# Patient Record
Sex: Female | Born: 1950 | Race: White | Hispanic: No | Marital: Married | Smoking: Former smoker
Health system: Southern US, Community
[De-identification: ages and names within clinical notes are randomized; demographics above are authoritative.]

## PROBLEM LIST (undated history)

## (undated) DIAGNOSIS — M1990 Unspecified osteoarthritis, unspecified site: Secondary | ICD-10-CM

## (undated) DIAGNOSIS — G709 Myoneural disorder, unspecified: Secondary | ICD-10-CM

## (undated) DIAGNOSIS — J449 Chronic obstructive pulmonary disease, unspecified: Secondary | ICD-10-CM

## (undated) DIAGNOSIS — E785 Hyperlipidemia, unspecified: Secondary | ICD-10-CM

## (undated) DIAGNOSIS — I10 Essential (primary) hypertension: Secondary | ICD-10-CM

## (undated) HISTORY — PX: TUBAL LIGATION: SHX77

## (undated) HISTORY — PX: JOINT REPLACEMENT: SHX530

---

## 1998-04-15 ENCOUNTER — Other Ambulatory Visit: Admission: RE | Admit: 1998-04-15 | Discharge: 1998-04-15 | Payer: Self-pay | Admitting: Obstetrics & Gynecology

## 1998-07-08 ENCOUNTER — Other Ambulatory Visit: Admission: RE | Admit: 1998-07-08 | Discharge: 1998-07-08 | Payer: Self-pay | Admitting: Obstetrics & Gynecology

## 1999-02-13 ENCOUNTER — Other Ambulatory Visit: Admission: RE | Admit: 1999-02-13 | Discharge: 1999-02-13 | Payer: Self-pay | Admitting: Obstetrics and Gynecology

## 2000-02-02 ENCOUNTER — Other Ambulatory Visit: Admission: RE | Admit: 2000-02-02 | Discharge: 2000-02-02 | Payer: Self-pay | Admitting: Obstetrics and Gynecology

## 2000-06-09 ENCOUNTER — Other Ambulatory Visit: Admission: RE | Admit: 2000-06-09 | Discharge: 2000-06-09 | Payer: Self-pay | Admitting: Otolaryngology

## 2000-10-09 ENCOUNTER — Encounter: Admission: RE | Admit: 2000-10-09 | Discharge: 2000-10-09 | Payer: Self-pay | Admitting: Orthopedic Surgery

## 2000-10-09 ENCOUNTER — Encounter: Payer: Self-pay | Admitting: Orthopedic Surgery

## 2001-02-04 ENCOUNTER — Other Ambulatory Visit: Admission: RE | Admit: 2001-02-04 | Discharge: 2001-02-04 | Payer: Self-pay | Admitting: *Deleted

## 2001-02-04 ENCOUNTER — Other Ambulatory Visit: Admission: RE | Admit: 2001-02-04 | Discharge: 2001-02-04 | Payer: Self-pay | Admitting: Obstetrics and Gynecology

## 2002-02-08 ENCOUNTER — Other Ambulatory Visit: Admission: RE | Admit: 2002-02-08 | Discharge: 2002-02-08 | Payer: Self-pay | Admitting: Obstetrics and Gynecology

## 2002-04-03 ENCOUNTER — Encounter: Admission: RE | Admit: 2002-04-03 | Discharge: 2002-04-03 | Payer: Self-pay | Admitting: Orthopedic Surgery

## 2002-04-03 ENCOUNTER — Encounter: Payer: Self-pay | Admitting: Orthopedic Surgery

## 2003-03-09 ENCOUNTER — Other Ambulatory Visit: Admission: RE | Admit: 2003-03-09 | Discharge: 2003-03-09 | Payer: Self-pay | Admitting: Obstetrics and Gynecology

## 2004-03-19 ENCOUNTER — Other Ambulatory Visit: Admission: RE | Admit: 2004-03-19 | Discharge: 2004-03-19 | Payer: Self-pay | Admitting: Obstetrics and Gynecology

## 2004-08-28 ENCOUNTER — Ambulatory Visit: Payer: Self-pay | Admitting: Internal Medicine

## 2004-09-09 ENCOUNTER — Ambulatory Visit: Payer: Self-pay | Admitting: Internal Medicine

## 2005-03-24 ENCOUNTER — Other Ambulatory Visit: Admission: RE | Admit: 2005-03-24 | Discharge: 2005-03-24 | Payer: Self-pay | Admitting: Obstetrics and Gynecology

## 2007-02-15 ENCOUNTER — Inpatient Hospital Stay (HOSPITAL_COMMUNITY): Admission: RE | Admit: 2007-02-15 | Discharge: 2007-02-20 | Payer: Self-pay | Admitting: Orthopedic Surgery

## 2007-06-19 ENCOUNTER — Encounter: Admission: RE | Admit: 2007-06-19 | Discharge: 2007-06-19 | Payer: Self-pay | Admitting: Orthopedic Surgery

## 2009-06-05 ENCOUNTER — Encounter (INDEPENDENT_AMBULATORY_CARE_PROVIDER_SITE_OTHER): Payer: Self-pay | Admitting: *Deleted

## 2009-06-06 ENCOUNTER — Ambulatory Visit: Payer: Self-pay | Admitting: Internal Medicine

## 2009-06-18 ENCOUNTER — Ambulatory Visit: Payer: Self-pay | Admitting: Internal Medicine

## 2009-06-20 ENCOUNTER — Encounter: Payer: Self-pay | Admitting: Internal Medicine

## 2009-09-19 ENCOUNTER — Encounter (INDEPENDENT_AMBULATORY_CARE_PROVIDER_SITE_OTHER): Payer: Self-pay | Admitting: *Deleted

## 2010-06-03 NOTE — Letter (Signed)
Summary: [facility] Instructions  [facility]  [address] [place], [place] [address]   Phone: [phone]  Fax: [phone]       Kelsey Sheppard    May 29, 1950    MRN: [id number]        Procedure Day Dorna Bloom:  Kelsey Sheppard  06/18/09     Arrival Time:  9:30AM     Procedure Time:  10:30AM     Location of Procedure:                    [name] _  [facility] (4th Floor)                        PREPARATION FOR COLONOSCOPY WITH MOVIPREP   Starting 5 days prior to your procedure 06/13/09 do not eat nuts, seeds, popcorn, corn, beans, peas,  salads, or any raw vegetables.  Do not take any fiber supplements (e.g. Metamucil, Citrucel, and Benefiber).  THE DAY BEFORE YOUR PROCEDURE         DATE: 06/17/09  DAY: MONDAY  1.  Drink clear liquids the entire day-NO SOLID FOOD  2.  Do not drink anything colored red or purple.  Avoid juices with pulp.  No orange juice.  3.  Drink at least 64 oz. (8 glasses) of fluid/clear liquids during the day to prevent dehydration and help the prep work efficiently.  CLEAR LIQUIDS INCLUDE: Water Jello Ice Popsicles Tea (sugar ok, no milk/cream) Powdered fruit flavored drinks Coffee (sugar ok, no milk/cream) Gatorade Juice: apple, white grape, white cranberry  Lemonade Clear bullion, consomm, broth Carbonated beverages (any kind) Strained chicken noodle soup Hard Candy                             4.  In the morning, mix first dose of MoviPrep solution:    Empty 1 Pouch A and 1 Pouch B into the disposable container    Add lukewarm drinking water to the top line of the container. Mix to dissolve    Refrigerate (mixed solution should be used within 24 hrs)  5.  Begin drinking the prep at 5:00 p.m. The MoviPrep container is divided by 4 marks.   Every 15 minutes drink the solution down to the next mark (approximately 8 oz) until the full liter is complete.   6.  Follow completed prep with 16 oz of clear liquid of your choice  (Nothing red or purple).  Continue to drink clear liquids until bedtime.  7.  Before going to bed, mix second dose of MoviPrep solution:    Empty 1 Pouch A and 1 Pouch B into the disposable container    Add lukewarm drinking water to the top line of the container. Mix to dissolve    Refrigerate  THE DAY OF YOUR PROCEDURE      DATE: 06/18/09  DAY: TUESDAY Beginning at 5:30AM (5 hours before procedure):         1. Every 15 minutes, drink the solution down to the next mark (approx 8 oz) until the full liter is complete.  2. Follow completed prep with 16 oz. of clear liquid of your choice.    3. You may drink clear liquids until 8:30AM (2 HOURS BEFORE PROCEDURE).   MEDICATION INSTRUCTIONS  Unless otherwise instructed, you should take regular prescription medications with a small sip of water   as early as possible the morning of  your procedure.           OTHER INSTRUCTIONS  You will need a responsible adult at least 60 years of age to accompany you and drive you home.   This person must remain in the waiting room during your procedure.  Wear loose fitting clothing that is easily removed.  Leave jewelry and other valuables at home.  However, you may wish to bring a book to read or  an iPod/MP3 player to listen to music as you wait for your procedure to start.  Remove all body piercing jewelry and leave at home.  Total time from sign-in until discharge is approximately 2-3 hours.  You should go home directly after your procedure and rest.  You can resume normal activities the  day after your procedure.  The day of your procedure you should not:   Drive   Make legal decisions   Operate machinery   Drink alcohol   Return to work  You will receive specific instructions about eating, activities and medications before you leave.    The above instructions have been reviewed and explained to me by   [name] RN  June 06, 2009 10:17 AM     I fully  understand and can verbalize these instructions _____________________________ Date _________

## 2010-06-03 NOTE — Procedures (Signed)
Summary: Colonoscopy  Patient: Kelsey Sheppard Note: All result statuses are Final unless otherwise noted.  Tests: (1) Colonoscopy (COL)   COL Colonoscopy           DONE     [facility]     [address]     [place], [place]  [address]           COLONOSCOPY PROCEDURE REPORT           PATIENT:  [name], [name]  MR#:  [id number]     BIRTHDATE:  Nov 19, 1950, 58 yrs. old  GENDER:  female           ENDOSCOPIST:  Wilhemina Bonito. Eda Keys, MD     Referred by:  Surveillance Program Recall           PROCEDURE DATE:  06/18/2009     PROCEDURE:  Colonoscopy with snare polypectomy x 4     ASA CLASS:  Class II     INDICATIONS:  history of pre-cancerous (adenomatous) colon polyps     (index exam 09-2004 w/ small adenomas)           MEDICATIONS:   Fentanyl 100 mcg IV, Versed 8 mg IV, Benadryl 25 mg     IV           DESCRIPTION OF PROCEDURE:   After the risks benefits and     alternatives of the procedure were thoroughly explained, informed     consent was obtained.  Digital rectal exam was performed and     revealed no abnormalities.   The LB CF-[id number] endoscope     was introduced through the anus and advanced to the cecum, which     was identified by both the appendix and ileocecal valve, without     limitations.Time to cecum = 6:03 min.  The quality of the prep was     excellent, using MoviPrep.  The instrument was then slowly     withdrawn (time = 14:12 min) as the colon was fully examined.     <<PROCEDUREIMAGES>>           FINDINGS:  Four polyps were found: 5mm in the ascending colon, 6mm     in the transveres and 6mm,3mm in the sigmoid colon. Polyps were     snared without cautery. Retrieval was successful.   Mild     diverticulosis was found in the sigmoid colon.   Retroflexed views     in the rectum revealed no abnormalities.    The scope was then     withdrawn from the patient and the procedure completed.           COMPLICATIONS:  None           ENDOSCOPIC IMPRESSION:  1) Four polyps - Removed     2) Mild diverticulosis in the sigmoid colon           RECOMMENDATIONS:     1) Follow up colonoscopy in in 3 years if 3 or more adenomas;     otherwise 5 years           ______________________________     Wilhemina Bonito. Eda Keys, MD           CC:  Laurann Montana, MD; Huel Cote, MD; The Patient           n.     eSIGNED:   Wilhemina Bonito. Eda Keys at 06/18/2009 12:23 PM  [name], [name], [phone]  Note: An exclamation mark (!) indicates a result that was not dispersed into the flowsheet. Document Creation Date: 06/18/2009 12:24 PM _______________________________________________________________________  (1) Order result status: Final Collection or observation date-time: 06/18/2009 12:16 Requested date-time:  Receipt date-time:  Reported date-time:  Referring Physician:   Ordering Physician: Fransico Setters [phone]) Specimen Source:  Source: Launa Grill Order Number: [id number] Lab site:   Appended Document: Colonoscopy     Procedures Next Due Date:    Colonoscopy: 06/2012

## 2010-06-03 NOTE — Miscellaneous (Signed)
Summary: Moviprep rx  Clinical Lists Changes  Medications: Added new medication of MOVIPREP 100 GM  SOLR (PEG-KCL-NACL-NASULF-NA ASC-C) As per prep instructions. - Signed Rx of MOVIPREP 100 GM  SOLR (PEG-KCL-NACL-NASULF-NA ASC-C) As per prep instructions.;  #1 x 0;  Signed;  Entered by: [name] RN;  Authorized by: Hilarie Fredrickson MD;  Method used: Electronically to [facility], [place], [place]  [id number], Ph: [phone], Fax: [phone]    Prescriptions: MOVIPREP 100 GM  SOLR (PEG-KCL-NACL-NASULF-NA ASC-C) As per prep instructions.  #1 x 0   Entered by:   [name] RN   Authorized by:   Hilarie Fredrickson MD   Signed by:   [name] RN on 06/06/2009   Method used:   Electronically to        [facility] (retail)       [address]       [place], [place]  [id number]       Ph: [phone]       Fax: [phone]   RxID:   [id number]

## 2010-06-03 NOTE — Letter (Signed)
Summary: Colonoscopy Letter  [facility]  [address] [place], [place] [address]   Phone: [phone]  Fax: [phone]      Sep 19, 2009 MRN: [id number]   [facility] [address] [place], [place]  [address]   Dear Ms. Vilma Meckel,   According to your medical record, it is time for you to schedule a Colonoscopy. The American Cancer Society recommends this procedure as a method to detect early colon cancer. Patients with a family history of colon cancer, or a personal history of colon polyps or inflammatory bowel disease are at increased risk.  This letter has been generated based on the recommendations made at the time of your procedure. If you feel that in your particular situation this may no longer apply, please contact our office.  Please call our office at [phone] to schedule this appointment or to update your records at your earliest convenience.  Thank you for cooperating with [place] to provide you with the very best care possible.   Sincerely,  Wilhemina Bonito. Marina Goodell, M.D.  [facility] [phone]

## 2010-06-03 NOTE — Letter (Signed)
Summary: Patient Notice- Polyp Results  [facility]  [address] [place], [place] [address]   Phone: [phone]  Fax: [phone]        June 20, 2009 MRN: [id number]    [facility] [address] [place], [place]  [address]    Dear Ms. Kelsey Sheppard,  I am pleased to inform you that the colon polyp(s) removed during your recent colonoscopy was (were) found to be benign (no cancer detected) upon pathologic examination.  I recommend you have a repeat colonoscopy examination in 3 years to look for recurrent polyps, as having colon polyps increases your risk for having recurrent polyps or even colon cancer in the future.  Should you develop new or worsening symptoms of abdominal pain, bowel habit changes or bleeding from the rectum or bowels, please schedule an evaluation with either your primary care physician or with me.  Additional information/recommendations:  __ No further action with gastroenterology is needed at this time. Please      follow-up with your primary care physician for your other healthcare      needs.   Please call us if you are having persistent problems or have questions about your condition that have not been fully answered at this time.  Sincerely,  Hilarie Fredrickson MD  This letter has been electronically signed by your physician.  Appended Document: Patient Notice- Polyp Results Letter mailed 2.18.11

## 2010-09-16 NOTE — Op Note (Signed)
[name], [name]               ACCOUNT NO.:  [id number]   MEDICAL RECORD NO.:  [id number]          PATIENT TYPE:  INP   LOCATION:  2899                         FACILITY:  [facility]   PHYSICIAN:  Burnard Bunting, M.D.    DATE OF BIRTH:  Dec 05, 1950   DATE OF PROCEDURE:  02/15/2007  DATE OF DISCHARGE:                               OPERATIVE REPORT   PREOPERATIVE DIAGNOSIS:  Left hip arthritis.   POSTOPERATIVE DIAGNOSIS:  Left hip arthritis.   PROCEDURE:  Left total hip replacement.   SURGEON:  Dorene Grebe, M.D.   ASSISTANT:  Jerolyn Shin. Tresa Res, M.D.   ANESTHESIA:  General endotracheal.   BLOOD LOSS:  500   INDICATIONS:  Kelsey Sheppard is a 60 year old patient with hip dysplasia  and hip arthritis.  She presents now for total hip replacement after  intractable pain and explanation of risks and benefits.   IMPLANTS:  SROM stem 13/18 with +12 lateral offset and +6 neck, 46 head.   PROCEDURE IN DETAIL:  The patient was brought to the operating room  where general endotracheal anesthesia was induced and preoperative  antibiotics administered.  The patient was placed in the lateral  decubitus position with the right peroneal nerve and right axillary  region well padded.  The patient's left leg, foot and hip were prepped  with DuraPrep solution and draped in a sterile manner.  Kelsey Sheppard was used  to cover the operative field.  Posterior approach to the hip utilized.  Skin and subcutaneous tissue were sharply divided.  Fascia lata was then  divided, and the gluteus maximus muscle was split in direction of its  fibers.  Piriformis tendon was tagged and retracted.  Sciatic nerve was  then palpated and protected at all times during the remaining portion of  the case.  Capsule was split in T-shaped fashion and retracted with  tagging sutures.  Head was dislocated.  Trial prosthesis was used to  make the femoral neck cut.  Cut was performed.  Canal was then reamed up  to 13.5 mm with excellent  chatter obtained.  At this time, the  acetabulum was reamed.  It was shallow.  Acetabulum was deepened to the  fovea.  Cup was then placed in approximately 45 degrees of abduction and  about 20 degrees of anteversion.  Trial stem was placed which was +8  offset and 0.  The patient had actually excellent range of motion with  good stability in full extension, internal rotation, position of sleep,  and 90 degrees of hip flexion, 10 degrees of adduction and 75 degrees of  internal rotation.  Trial components were removed.  True components were  placed.  However, after placement of the true components, clicking with  external rotation and extension was noted.  Soft tissue was released.  Soft tissue impingement on the neck was noted, and it was attempted to  be released.  However, the affliction persisted for this reason.  That  stem was removed.  A +12 trial stem in left anteversion was then placed.  This resolved the anterior clicking.  The true components were  then  placed in neutral version with +6 head and 12 lateral offset.  This gave  excellent stability to full extension and external rotation, position of  sleep as well as 90 degrees of hip flexion and 20 degrees of adduction  and 70 degrees of internal rotation.  Complete irrigation was performed.  Hemovac drain was placed.  Fascia lata was then closed over Hemovac  drains.  Sciatic nerve was again palpated and found be intact.  Number 1  Vicryl was used to close the fascia lata.  The next layer was closed  using interrupted inverted 0 Vicryl suture, 3-0 Vicryl suture and skin  staples.  The patient's leg lengths were equal at the conclusion of the  case.  A Mepilex dressing was placed.  Knee immobilizer was placed.  It  should be noted that Dr. Lenny Pastel assistance was required at all times  during the case for retraction of important neurovascular structures.      Burnard Bunting, M.D.  Electronically Signed     GSD/MEDQ  D:   02/15/2007  T:  02/16/2007  Job:  [id number]

## 2010-09-19 NOTE — Discharge Summary (Signed)
NAMELIRA, STEPHEN               ACCOUNT NO.:  [id number]   MEDICAL RECORD NO.:  [id number]          PATIENT TYPE:  INP   LOCATION:  5017                         FACILITY:  [facility]   PHYSICIAN:  Burnard Bunting, M.D.    DATE OF BIRTH:  05-17-1950   DATE OF ADMISSION:  02/15/2007  DATE OF DISCHARGE:  02/20/2007                               DISCHARGE SUMMARY   DISCHARGE DIAGNOSIS:  Left hip arthritis.   SECONDARY DIAGNOSIS:  Hypercholesterolemia.   OPERATIONS AND PROCEDURES:  Left total hip replacement performed February 15, 2007.   HOSPITAL COURSE:  Kelsey Sheppard is a 60 year old patient with end stage  left hip arthritis.  She presents now for left total hip replacement,  which was performed February 15, 2007.  Patient tolerated the procedure  well without immediate complications.  Postop day #1, leg lengths were  equal.  Hematocrit was 26.  The foot was perfused, sensate, and mobile  at that time.  Coumadin was started for DVT prophylaxis.  Therapy was  started weightbearing as tolerated.  CPM was also started for passive  hip mobilization and range of motion.  Patient had an otherwise  unremarkable recovery.  Patient received a transfusion of 1 unit of  packed red blood cells for a hemoglobin of 7.6 on February 19, 2007.  Patient was discharged home February 20, 2007 in good condition.  She  will follow up with me in 7 days for suture removal.   DISCHARGE MEDICATIONS:  1. Lovastatin 40 mg p.o. daily.  2. Percocet 1-2 p.o. q.3-4 hours p.r.n. pain.  3. Robaxin 500 mg p.o. q.6 hours p.r.n. spasm.  4. Coumadin 5 mg p.o. daily to INR 2 to 2.5.      Burnard Bunting, M.D.  Electronically Signed     GSD/MEDQ  D:  04/03/2007  T:  04/04/2007  Job:  [id number]

## 2011-02-11 LAB — CBC
HCT: 22.1 — ABNORMAL LOW
HCT: 25.5 — ABNORMAL LOW
HCT: 28 — ABNORMAL LOW
HCT: 29.4 — ABNORMAL LOW
Hemoglobin: 10.1 — ABNORMAL LOW
Hemoglobin: 8.9 — ABNORMAL LOW
Hemoglobin: 9.8 — ABNORMAL LOW
MCHC: 34.3
MCHC: 34.5
MCHC: 34.7
MCHC: 34.8
MCV: 90.6
MCV: 91.8
MCV: 92.3
MCV: 92.5
Platelets: 241
Platelets: 242
Platelets: 258
RBC: 2.67 — ABNORMAL LOW
RDW: 12.4
RDW: 12.5
RDW: 12.6
RDW: 12.9
RDW: 13.2

## 2011-02-11 LAB — BASIC METABOLIC PANEL
BUN: 6
CO2: 29
Calcium: 8 — ABNORMAL LOW
Calcium: 8.2 — ABNORMAL LOW
Creatinine, Ser: 0.43
Creatinine, Ser: 0.59
GFR calc Af Amer: >60
GFR calc non Af Amer: >60
GFR calc non Af Amer: >60
Glucose, Bld: 120 — ABNORMAL HIGH
Sodium: 138

## 2011-02-11 LAB — CROSSMATCH

## 2011-02-11 LAB — PROTIME-INR
INR: 1.7 — ABNORMAL HIGH
INR: 1.8 — ABNORMAL HIGH
Prothrombin Time: 14.3
Prothrombin Time: 21.4 — ABNORMAL HIGH

## 2011-02-11 LAB — CHOLESTEROL, TOTAL: Cholesterol: 142

## 2011-02-12 LAB — BASIC METABOLIC PANEL
BUN: 5 — ABNORMAL LOW
CO2: 31
Chloride: 104
Creatinine, Ser: 0.52
GFR calc Af Amer: >60
Glucose, Bld: 93

## 2011-02-12 LAB — POCT I-STAT 4, (NA,K, GLUC, HGB,HCT)
HCT: 29 — ABNORMAL LOW
Hemoglobin: 9.9 — ABNORMAL LOW
Operator id: 177011

## 2011-02-12 LAB — CBC
MCHC: 33.8
MCV: 94.3
RBC: 5.05
RDW: 12.9

## 2011-02-12 LAB — URINALYSIS, ROUTINE W REFLEX MICROSCOPIC
Bilirubin Urine: NEGATIVE
Glucose, UA: NEGATIVE
Hgb urine dipstick: NEGATIVE
Protein, ur: NEGATIVE

## 2011-02-12 LAB — TYPE AND SCREEN
ABO/RH(D): O POS
ABO/RH(D): O POS
Antibody Screen: NEGATIVE

## 2011-02-12 LAB — ABO/RH: ABO/RH(D): O POS

## 2011-02-12 LAB — PROTIME-INR
INR: 1.1
Prothrombin Time: 12.5

## 2012-03-29 ENCOUNTER — Encounter (HOSPITAL_COMMUNITY): Payer: Self-pay | Admitting: Pharmacy Technician

## 2012-04-04 ENCOUNTER — Other Ambulatory Visit: Payer: Self-pay | Admitting: Orthopedic Surgery

## 2012-04-11 ENCOUNTER — Ambulatory Visit (HOSPITAL_COMMUNITY)
Admission: RE | Admit: 2012-04-11 | Discharge: 2012-04-11 | Disposition: A | Discharge status: Home / Self Care | Payer: Medicare HMO | Source: Ambulatory Visit | Attending: Orthopedic Surgery | Admitting: Orthopedic Surgery

## 2012-04-11 ENCOUNTER — Encounter (HOSPITAL_COMMUNITY)
Admission: RE | Admit: 2012-04-11 | Discharge: 2012-04-11 | Disposition: A | Discharge status: Home / Self Care | Payer: Medicare HMO | Source: Ambulatory Visit | Attending: Orthopedic Surgery | Admitting: Orthopedic Surgery

## 2012-04-11 ENCOUNTER — Encounter (HOSPITAL_COMMUNITY): Payer: Self-pay

## 2012-04-11 DIAGNOSIS — Z01811 Encounter for preprocedural respiratory examination: Secondary | ICD-10-CM | POA: Insufficient documentation

## 2012-04-11 HISTORY — DX: Unspecified osteoarthritis, unspecified site: M19.90

## 2012-04-11 HISTORY — DX: Chronic obstructive pulmonary disease, unspecified: J44.9

## 2012-04-11 HISTORY — DX: Essential (primary) hypertension: I10

## 2012-04-11 HISTORY — DX: Myoneural disorder, unspecified: G70.9

## 2012-04-11 HISTORY — DX: Hyperlipidemia, unspecified: E78.5

## 2012-04-11 LAB — CBC WITH DIFFERENTIAL/PLATELET
Basophils Relative: 1 % (ref 0–1)
Eosinophils Absolute: 0.2 10*3/uL (ref 0.0–0.7)
Eosinophils Relative: 3 % (ref 0–5)
Hemoglobin: 14.9 g/dL (ref 12.0–15.0)
Lymphs Abs: 2.9 10*3/uL (ref 0.7–4.0)
MCH: 30.3 pg (ref 26.0–34.0)
MCHC: 32.5 g/dL (ref 30.0–36.0)
MCV: 93.1 fL (ref 78.0–100.0)
Monocytes Absolute: 0.5 10*3/uL (ref 0.1–1.0)
Monocytes Relative: 6 % (ref 3–12)
Neutrophils Relative %: 53 % (ref 43–77)

## 2012-04-11 LAB — BASIC METABOLIC PANEL
BUN: 11 mg/dL (ref 6–23)
CO2: 28 mEq/L (ref 19–32)
GFR calc non Af Amer: >90 mL/min (ref >90)
Glucose, Bld: 96 mg/dL (ref 70–99)
Potassium: 4.1 mEq/L (ref 3.5–5.1)

## 2012-04-11 LAB — PROTIME-INR: INR: 0.91 (ref 0.00–1.49)

## 2012-04-11 LAB — URINE MICROSCOPIC-ADD ON

## 2012-04-11 LAB — URINALYSIS, ROUTINE W REFLEX MICROSCOPIC
Glucose, UA: NEGATIVE mg/dL
Nitrite: NEGATIVE
Specific Gravity, Urine: 1.012 (ref 1.005–1.030)
pH: 6 (ref 5.0–8.0)

## 2012-04-11 LAB — SURGICAL PCR SCREEN: MRSA, PCR: NEGATIVE

## 2012-04-11 LAB — TYPE AND SCREEN

## 2012-04-11 NOTE — Pre-Procedure Instructions (Signed)
20 Kelsey Sheppard  04/11/2012   Your procedure is scheduled on:  04-13-2012  Report to [facility] at 10:45 AM.  Call this number if you have problems the morning of surgery: [phone]   Remember:   Do not eat food or drink:After Midnight.      Take these medicines the morning of surgery with A SIP OF WATER: Tylenol as needed,Spiriva   Do not wear jewelry, make-up or nail polish.  Do not wear lotions, powders, or perfumes.   Do not shave 48 hours prior to surgery..  Do not bring valuables to the hospital.  Contacts, dentures or bridgework may not be worn into surgery.  Leave suitcase in the car. After surgery it may be brought to your room.   For patients admitted to the hospital, checkout time is 11:00 AM the day of discharge.  Marland Kitchen   Special Instructions: Shower using CHG 2 nights before surgery and the night before surgery.  If you shower the day of surgery use CHG.  Use special wash - you have one bottle of CHG for all showers.  You should use approximately 1/3 of the bottle for each shower.     Please read over the following fact sheets that you were given: Pain Booklet, Coughing and Deep Breathing, Blood Transfusion Information, MRSA Information and Surgical Site Infection Prevention

## 2012-04-12 MED ORDER — CEFAZOLIN SODIUM-DEXTROSE 2-3 GM-% IV SOLR
2.0000 g | INTRAVENOUS | Status: AC
  Administered 2012-04-13: 2 g via INTRAVENOUS
  Filled 2012-04-12: qty 50

## 2012-04-12 NOTE — Consult Note (Signed)
Anesthesia chart review: Patient is a 61 year old female scheduled for left total hip revision by Dr. Turner Daniels on 04/13/2012. History includes former smoker, obesity, hypertension, COPD, hyperlipidemia, arthritis, left THR 02/2007. No specific PCP is listed.  Preoperative labs noted.  Chest x-ray report on 04/11/2012 showed no acute finding in a low volume chest.  EKG on 04/11/2012 showed normal sinus rhythm, left axis deviation, low voltage QRS, incomplete right bundle branch block. It was not felt significantly changed from her previous EKG on 02/11/2007.  Anticipate she can proceed as planned.  Shonna Chock, PA-C

## 2012-04-12 NOTE — H&P (Signed)
TOTAL HIP REVISION ADMISSION H&P  Patient is admitted for left revision total hip arthroplasty.  Subjective:  Chief Complaint: left hip pain  HPI: Kelsey Sheppard, 61 y.o. female, has a history of pain and functional disability in the left hip due to arthritis and patient has failed non-surgical conservative treatments for greater than 12 weeks to include NSAID's and/or analgesics and use of assistive devices. The indications for the revision total hip arthroplasty are fluid collection on MARS and pain.  Onset of symptoms was gradual starting 1 years ago with gradually worsening course since that time.  Prior procedures on the left hip include arthroplasty.  Patient currently rates pain in the left hip at 7 out of 10 with activity.  There is worsening of pain with activity and weight bearing, pain that interfers with activities of daily living and crepitus.  This condition presents safety issues increasing the risk of falls.  There is no current active infection.  There are no active problems to display for this patient.  Past Medical History  Diagnosis Date  . Hypertension   . COPD (chronic obstructive pulmonary disease)   . 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 Arthritis   . Hyperlipemia     Past Surgical History  Procedure Date  . Joint replacement     left hip  . Cesarean section   . Tubal ligation     No prescriptions prior to admission   No Known Allergies  History  Substance Use Topics  . Smoking status: Former Smoker -- 1.0 packs/day for 30 years    Types: Cigarettes    Quit date: 05/04/2006  . Smokeless tobacco: Not on file  . Alcohol Use: No    No family history on file.    Review of Systems  Constitutional: Negative.   HENT: Negative.   Eyes: Negative.   Respiratory: Negative.   Cardiovascular: Negative.   Gastrointestinal: Negative.   Genitourinary: Negative.   Musculoskeletal: Positive for joint pain.  Skin: Negative.    Neurological: Negative.   Endo/Heme/Allergies: Negative.   Psychiatric/Behavioral: Negative.     Objective:  Physical Exam  Constitutional: She is oriented to person, place, and time. She appears well-developed and well-nourished.  HENT:  Head: Normocephalic.  Cardiovascular: Normal heart sounds.   Respiratory: Breath sounds normal.  GI: Bowel sounds are normal.  Musculoskeletal:       Left hip: She exhibits tenderness and crepitus.  Neurological: She is alert and oriented to person, place, and time.  Psychiatric: She has a normal mood and affect.    Vital signs in last 24 hours: Temp:  [98 F (36.7 C)] 98 F (36.7 C) (12/09 1115) Pulse Rate:  [78] 78  (12/09 1115) Resp:  [20] 20  (12/09 1115) BP: (137)/(87) 137/87 mmHg (12/09 1115) SpO2:  [96 %] 96 % (12/09 1115) Weight:  [103.7 kg (228 lb 9.9 oz)] 103.7 kg (228 lb 9.9 oz) (12/09 1115)   Labs:   There is no height or weight on file to calculate BMI.  Imaging Review:  Plain radiographs demonstrate no evidence of loosening of the acetabular cup.The bone quality appears to be adequate for age and reported activity level. There is a large fluid collection on the MARS MRI.  Assessment/Plan:  End stage arthritis, left hip(s) with failed previous arthroplasty.  The patient history, physical examination, clinical judgement of the provider and imaging studies are consistent with end stage degenerative joint disease of the left hip(s),  previous total hip arthroplasty. Revision total hip arthroplasty is deemed medically necessary. The treatment options including medical management, injection therapy, arthroscopy and arthroplasty were discussed at length. The risks and benefits of total hip arthroplasty were presented and reviewed. The risks due to aseptic loosening, infection, stiffness, dislocation/subluxation,  thromboembolic complications and other imponderables were discussed.  The patient acknowledged the explanation, agreed  to proceed with the plan and consent was signed. Patient is being admitted for inpatient treatment for surgery, pain control, PT, OT, prophylactic antibiotics, VTE prophylaxis, progressive ambulation and ADL's and discharge planning. The patient is planning to be discharged home with home health services

## 2012-04-13 ENCOUNTER — Encounter (HOSPITAL_COMMUNITY): Payer: Self-pay | Admitting: *Deleted

## 2012-04-13 ENCOUNTER — Encounter (HOSPITAL_COMMUNITY): Payer: Self-pay | Admitting: Vascular Surgery

## 2012-04-13 ENCOUNTER — Inpatient Hospital Stay (HOSPITAL_COMMUNITY): Payer: Medicare HMO | Admitting: Vascular Surgery

## 2012-04-13 ENCOUNTER — Encounter (HOSPITAL_COMMUNITY): Payer: Self-pay | Admitting: Certified Registered Nurse Anesthetist

## 2012-04-13 ENCOUNTER — Inpatient Hospital Stay (HOSPITAL_COMMUNITY): Payer: Medicare HMO

## 2012-04-13 ENCOUNTER — Encounter (HOSPITAL_COMMUNITY)
Admission: RE | Disposition: A | Discharge status: Home Health | Payer: Self-pay | Source: Ambulatory Visit | Attending: Orthopedic Surgery

## 2012-04-13 ENCOUNTER — Inpatient Hospital Stay (HOSPITAL_COMMUNITY)
Admission: RE | Admit: 2012-04-13 | Discharge: 2012-04-15 | DRG: 468 | Disposition: A | Discharge status: Home Health | Payer: Medicare HMO | Source: Ambulatory Visit | Attending: Orthopedic Surgery | Admitting: Orthopedic Surgery

## 2012-04-13 DIAGNOSIS — T8489XA Other specified complication of internal orthopedic prosthetic devices, implants and grafts, initial encounter: Principal | ICD-10-CM | POA: Diagnosis present

## 2012-04-13 DIAGNOSIS — E785 Hyperlipidemia, unspecified: Secondary | ICD-10-CM | POA: Diagnosis present

## 2012-04-13 DIAGNOSIS — Z87891 Personal history of nicotine dependence: Secondary | ICD-10-CM

## 2012-04-13 DIAGNOSIS — Y831 Surgical operation with implant of artificial internal device as the cause of abnormal reaction of the patient, or of later complication, without mention of misadventure at the time of the procedure: Secondary | ICD-10-CM | POA: Diagnosis present

## 2012-04-13 DIAGNOSIS — T84018A Broken internal joint prosthesis, other site, initial encounter: Secondary | ICD-10-CM

## 2012-04-13 DIAGNOSIS — Z79899 Other long term (current) drug therapy: Secondary | ICD-10-CM

## 2012-04-13 DIAGNOSIS — Y92009 Unspecified place in unspecified non-institutional (private) residence as the place of occurrence of the external cause: Secondary | ICD-10-CM

## 2012-04-13 DIAGNOSIS — J449 Chronic obstructive pulmonary disease, unspecified: Secondary | ICD-10-CM | POA: Diagnosis present

## 2012-04-13 DIAGNOSIS — J4489 Other specified chronic obstructive pulmonary disease: Secondary | ICD-10-CM | POA: Diagnosis present

## 2012-04-13 DIAGNOSIS — Z96649 Presence of unspecified artificial hip joint: Secondary | ICD-10-CM

## 2012-04-13 DIAGNOSIS — I10 Essential (primary) hypertension: Secondary | ICD-10-CM | POA: Diagnosis present

## 2012-04-13 DIAGNOSIS — Z7982 Long term (current) use of aspirin: Secondary | ICD-10-CM

## 2012-04-13 HISTORY — PX: TOTAL HIP REVISION: SHX763

## 2012-04-13 SURGERY — TOTAL HIP REVISION
Anesthesia: General | Site: Hip | Laterality: Left | Wound class: Clean

## 2012-04-13 MED ORDER — KCL IN DEXTROSE-NACL 20-5-0.45 MEQ/L-%-% IV SOLN
INTRAVENOUS | Status: AC
  Filled 2012-04-13: qty 1000

## 2012-04-13 MED ORDER — METOCLOPRAMIDE HCL 5 MG/ML IJ SOLN: 5.0000 mg | Freq: Three times a day (TID) | INTRAMUSCULAR | Status: DC | PRN

## 2012-04-13 MED ORDER — DIPHENHYDRAMINE HCL 12.5 MG/5ML PO ELIX
12.5000 mg | ORAL_SOLUTION | ORAL | Status: DC | PRN
Start: 2012-04-13 — End: 2012-04-15

## 2012-04-13 MED ORDER — ONDANSETRON HCL 4 MG PO TABS
4.0000 mg | ORAL_TABLET | Freq: Four times a day (QID) | ORAL | Status: DC | PRN
  Administered 2012-04-14 – 2012-04-15 (×2): 4 mg via ORAL
  Filled 2012-04-13 (×2): qty 1

## 2012-04-13 MED ORDER — DEXAMETHASONE SODIUM PHOSPHATE 4 MG/ML IJ SOLN
INTRAMUSCULAR | Status: DC | PRN
  Administered 2012-04-13: 4 mg via INTRAVENOUS

## 2012-04-13 MED ORDER — LOSARTAN POTASSIUM 50 MG PO TABS
100.0000 mg | ORAL_TABLET | Freq: Every day | ORAL | Status: DC
  Administered 2012-04-13 – 2012-04-14 (×2): 100 mg via ORAL
  Filled 2012-04-13 (×3): qty 2

## 2012-04-13 MED ORDER — OXYCODONE HCL 5 MG PO TABS: 5.0000 mg | ORAL_TABLET | Freq: Once | ORAL | Status: DC | PRN

## 2012-04-13 MED ORDER — ROCURONIUM BROMIDE 100 MG/10ML IV SOLN
INTRAVENOUS | Status: DC | PRN
  Administered 2012-04-13: 50 mg via INTRAVENOUS

## 2012-04-13 MED ORDER — ACETAMINOPHEN 10 MG/ML IV SOLN
1000.0000 mg | Freq: Four times a day (QID) | INTRAVENOUS | Status: AC
  Administered 2012-04-13 – 2012-04-14 (×4): 1000 mg via INTRAVENOUS
  Filled 2012-04-13 (×4): qty 100

## 2012-04-13 MED ORDER — MIDAZOLAM HCL 5 MG/5ML IJ SOLN
INTRAMUSCULAR | Status: DC | PRN
  Administered 2012-04-13: 2 mg via INTRAVENOUS

## 2012-04-13 MED ORDER — ACETAMINOPHEN 325 MG PO TABS: 650.0000 mg | ORAL_TABLET | Freq: Four times a day (QID) | ORAL | Status: DC | PRN

## 2012-04-13 MED ORDER — TIOTROPIUM BROMIDE MONOHYDRATE 18 MCG IN CAPS
18.0000 ug | ORAL_CAPSULE | Freq: Every day | RESPIRATORY_TRACT | Status: DC
  Administered 2012-04-14 – 2012-04-15 (×2): 18 ug via RESPIRATORY_TRACT
  Filled 2012-04-13: qty 5

## 2012-04-13 MED ORDER — PROPOFOL 10 MG/ML IV BOLUS
INTRAVENOUS | Status: DC | PRN
  Administered 2012-04-13: 200 mg via INTRAVENOUS

## 2012-04-13 MED ORDER — ALBUMIN HUMAN 5 % IV SOLN
INTRAVENOUS | Status: DC | PRN
  Administered 2012-04-13: 14:00:00 via INTRAVENOUS

## 2012-04-13 MED ORDER — ZOLPIDEM TARTRATE 5 MG PO TABS
10.0000 mg | ORAL_TABLET | Freq: Every evening | ORAL | Status: DC | PRN
  Administered 2012-04-14: 10 mg via ORAL
  Filled 2012-04-13: qty 2

## 2012-04-13 MED ORDER — METHOCARBAMOL 100 MG/ML IJ SOLN
500.0000 mg | Freq: Four times a day (QID) | INTRAVENOUS | Status: DC | PRN
  Filled 2012-04-13: qty 5

## 2012-04-13 MED ORDER — LACTATED RINGERS IV SOLN
INTRAVENOUS | Status: DC | PRN
  Administered 2012-04-13 (×2): via INTRAVENOUS

## 2012-04-13 MED ORDER — HYDROCHLOROTHIAZIDE 12.5 MG PO CAPS
12.5000 mg | ORAL_CAPSULE | Freq: Every day | ORAL | Status: DC
  Administered 2012-04-14: 12.5 mg via ORAL
  Filled 2012-04-13 (×4): qty 1

## 2012-04-13 MED ORDER — ALUM & MAG HYDROXIDE-SIMETH 200-200-20 MG/5ML PO SUSP: 30.0000 mL | ORAL | Status: DC | PRN

## 2012-04-13 MED ORDER — PHENOL 1.4 % MT LIQD
1.0000 | OROMUCOSAL | Status: DC | PRN
  Administered 2012-04-13: 1 via OROMUCOSAL
  Filled 2012-04-13: qty 177

## 2012-04-13 MED ORDER — MENTHOL 3 MG MT LOZG
1.0000 | LOZENGE | OROMUCOSAL | Status: DC | PRN
  Filled 2012-04-13: qty 9

## 2012-04-13 MED ORDER — METOCLOPRAMIDE HCL 5 MG/ML IJ SOLN: 10.0000 mg | Freq: Once | INTRAMUSCULAR | Status: DC | PRN

## 2012-04-13 MED ORDER — OXYCODONE HCL 5 MG/5ML PO SOLN: 5.0000 mg | Freq: Once | ORAL | Status: DC | PRN

## 2012-04-13 MED ORDER — FLEET ENEMA 7-19 GM/118ML RE ENEM: 1.0000 | ENEMA | Freq: Once | RECTAL | Status: AC | PRN

## 2012-04-13 MED ORDER — METOCLOPRAMIDE HCL 10 MG PO TABS: 5.0000 mg | ORAL_TABLET | Freq: Three times a day (TID) | ORAL | Status: DC | PRN

## 2012-04-13 MED ORDER — SODIUM CHLORIDE 0.9 % IR SOLN
Status: DC | PRN
  Administered 2012-04-13: 1000 mL

## 2012-04-13 MED ORDER — ASPIRIN EC 325 MG PO TBEC
325.0000 mg | DELAYED_RELEASE_TABLET | Freq: Two times a day (BID) | ORAL | Status: DC
  Administered 2012-04-13 – 2012-04-14 (×3): 325 mg via ORAL
  Filled 2012-04-13 (×5): qty 1

## 2012-04-13 MED ORDER — MAGNESIUM HYDROXIDE 400 MG/5ML PO SUSP: 30.0000 mL | Freq: Every day | ORAL | Status: DC | PRN

## 2012-04-13 MED ORDER — BUPIVACAINE-EPINEPHRINE 0.5% -1:200000 IJ SOLN
INTRAMUSCULAR | Status: DC | PRN
  Administered 2012-04-13: 20 mL

## 2012-04-13 MED ORDER — CELECOXIB 200 MG PO CAPS
200.0000 mg | ORAL_CAPSULE | Freq: Two times a day (BID) | ORAL | Status: DC
  Administered 2012-04-13 – 2012-04-14 (×3): 200 mg via ORAL
  Filled 2012-04-13 (×5): qty 1

## 2012-04-13 MED ORDER — ONDANSETRON HCL 4 MG/2ML IJ SOLN
INTRAMUSCULAR | Status: DC | PRN
  Administered 2012-04-13: 4 mg via INTRAVENOUS

## 2012-04-13 MED ORDER — LACTATED RINGERS IV SOLN
INTRAVENOUS | Status: DC
  Administered 2012-04-13: 12:00:00 via INTRAVENOUS

## 2012-04-13 MED ORDER — OXYCODONE HCL 5 MG PO TABS
5.0000 mg | ORAL_TABLET | ORAL | Status: DC | PRN
  Administered 2012-04-14 – 2012-04-15 (×5): 10 mg via ORAL
  Filled 2012-04-13 (×5): qty 2

## 2012-04-13 MED ORDER — KCL IN DEXTROSE-NACL 20-5-0.45 MEQ/L-%-% IV SOLN
INTRAVENOUS | Status: DC
  Administered 2012-04-13: 17:00:00 via INTRAVENOUS
  Filled 2012-04-13 (×7): qty 1000

## 2012-04-13 MED ORDER — DEXTROSE-NACL 5-0.45 % IV SOLN: INTRAVENOUS | Status: DC

## 2012-04-13 MED ORDER — METHOCARBAMOL 500 MG PO TABS
500.0000 mg | ORAL_TABLET | Freq: Four times a day (QID) | ORAL | Status: DC | PRN
  Administered 2012-04-14 – 2012-04-15 (×3): 500 mg via ORAL
  Filled 2012-04-13 (×3): qty 1

## 2012-04-13 MED ORDER — HYDROMORPHONE HCL PF 1 MG/ML IJ SOLN
0.2500 mg | INTRAMUSCULAR | Status: DC | PRN
  Administered 2012-04-13 (×2): 0.5 mg via INTRAVENOUS

## 2012-04-13 MED ORDER — ONDANSETRON HCL 4 MG/2ML IJ SOLN
4.0000 mg | Freq: Four times a day (QID) | INTRAMUSCULAR | Status: DC | PRN
  Administered 2012-04-13: 4 mg via INTRAVENOUS
  Filled 2012-04-13: qty 2

## 2012-04-13 MED ORDER — ACETAMINOPHEN 650 MG RE SUPP: 650.0000 mg | Freq: Four times a day (QID) | RECTAL | Status: DC | PRN

## 2012-04-13 MED ORDER — HYDROMORPHONE HCL PF 1 MG/ML IJ SOLN
INTRAMUSCULAR | Status: AC
  Filled 2012-04-13: qty 1

## 2012-04-13 MED ORDER — ATORVASTATIN CALCIUM 20 MG PO TABS
20.0000 mg | ORAL_TABLET | Freq: Every day | ORAL | Status: DC
  Administered 2012-04-13 – 2012-04-14 (×2): 20 mg via ORAL
  Filled 2012-04-13 (×3): qty 1

## 2012-04-13 MED ORDER — BISACODYL 5 MG PO TBEC: 5.0000 mg | DELAYED_RELEASE_TABLET | Freq: Every day | ORAL | Status: DC | PRN

## 2012-04-13 MED ORDER — LOSARTAN POTASSIUM-HCTZ 100-12.5 MG PO TABS: 1.0000 | ORAL_TABLET | Freq: Every day | ORAL | Status: DC

## 2012-04-13 MED ORDER — CHLORHEXIDINE GLUCONATE 4 % EX LIQD: 60.0000 mL | Freq: Once | CUTANEOUS | Status: DC

## 2012-04-13 MED ORDER — BUPIVACAINE-EPINEPHRINE (PF) 0.5% -1:200000 IJ SOLN
INTRAMUSCULAR | Status: AC
  Filled 2012-04-13: qty 10

## 2012-04-13 MED ORDER — FENTANYL CITRATE 0.05 MG/ML IJ SOLN
INTRAMUSCULAR | Status: DC | PRN
  Administered 2012-04-13 (×2): 100 ug via INTRAVENOUS
  Administered 2012-04-13 (×3): 50 ug via INTRAVENOUS

## 2012-04-13 MED ORDER — HYDROMORPHONE HCL PF 1 MG/ML IJ SOLN
1.0000 mg | INTRAMUSCULAR | Status: DC | PRN
  Administered 2012-04-13: 1 mg via INTRAVENOUS
  Filled 2012-04-13: qty 1

## 2012-04-13 MED ORDER — LIDOCAINE HCL (CARDIAC) 20 MG/ML IV SOLN
INTRAVENOUS | Status: DC | PRN
  Administered 2012-04-13: 90 mg via INTRAVENOUS

## 2012-04-13 SURGICAL SUPPLY — 67 items
BOWL SMART MIX CTS (DISPOSABLE) IMPLANT
BRUSH FEMORAL CANAL (MISCELLANEOUS) IMPLANT
CLOTH BEACON ORANGE TIMEOUT ST (SAFETY) ×2 IMPLANT
COVER SURGICAL LIGHT HANDLE (MISCELLANEOUS) ×2 IMPLANT
CUP ACET PNNCL SECTR W/GRIP 56 (Hips) IMPLANT
DRAPE C-ARM 42X72 X-RAY (DRAPES) IMPLANT
DRAPE ORTHO SPLIT 77X108 STRL (DRAPES) ×2
DRAPE PROXIMA HALF (DRAPES) ×2 IMPLANT
DRAPE SURG ORHT 6 SPLT 77X108 (DRAPES) ×1 IMPLANT
DRAPE U-SHAPE 47X51 STRL (DRAPES) ×2 IMPLANT
DRILL BIT 7/64X5 (BIT) ×2 IMPLANT
DRSG MEPILEX BORDER 4X8 (GAUZE/BANDAGES/DRESSINGS) ×2 IMPLANT
DURAPREP 26ML APPLICATOR (WOUND CARE) ×2 IMPLANT
ELECT BLADE 4.0 EZ CLEAN MEGAD (MISCELLANEOUS)
ELECT BLADE 6.5 EXT (BLADE) IMPLANT
ELECT REM PT RETURN 9FT ADLT (ELECTROSURGICAL) ×2
ELECTRODE BLDE 4.0 EZ CLN MEGD (MISCELLANEOUS) IMPLANT
ELECTRODE REM PT RTRN 9FT ADLT (ELECTROSURGICAL) ×1 IMPLANT
ELIMINATOR HOLE APEX DEPUY (Hips) ×1 IMPLANT
EVACUATOR 1/8 PVC DRAIN (DRAIN) ×1 IMPLANT
GAUZE XEROFORM 5X9 LF (GAUZE/BANDAGES/DRESSINGS) ×2 IMPLANT
GLOVE BIO SURGEON STRL SZ7 (GLOVE) ×2 IMPLANT
GLOVE BIO SURGEON STRL SZ7.5 (GLOVE) ×2 IMPLANT
GLOVE BIOGEL PI IND STRL 7.0 (GLOVE) ×1 IMPLANT
GLOVE BIOGEL PI IND STRL 8 (GLOVE) ×1 IMPLANT
GLOVE BIOGEL PI INDICATOR 7.0 (GLOVE) ×1
GLOVE BIOGEL PI INDICATOR 8 (GLOVE) ×1
GOWN PREVENTION PLUS XLARGE (GOWN DISPOSABLE) ×6 IMPLANT
GOWN STRL NON-REIN LRG LVL3 (GOWN DISPOSABLE) ×4 IMPLANT
HANDPIECE INTERPULSE COAX TIP (DISPOSABLE)
HEAD CERAMIC BIOLOX DELTA 36 6 (Hips) ×1 IMPLANT
HEEL PROTECTOR  874200 (MISCELLANEOUS)
HEEL PROTECTOR 874200 (MISCELLANEOUS) IMPLANT
HOOD PEEL AWAY FACE SHEILD DIS (HOOD) ×4 IMPLANT
KIT BASIN OR (CUSTOM PROCEDURE TRAY) ×2 IMPLANT
KIT ROOM TURNOVER OR (KITS) ×2 IMPLANT
LINER MARATHON 4MM 10D 36X56 (Hips) ×1 IMPLANT
MANIFOLD NEPTUNE II (INSTRUMENTS) ×2 IMPLANT
NEEDLE 22X1 1/2 (OR ONLY) (NEEDLE) ×2 IMPLANT
NOZZLE PRISM 8.5MM (MISCELLANEOUS) IMPLANT
NS IRRIG 1000ML POUR BTL (IV SOLUTION) ×4 IMPLANT
PACK TOTAL JOINT (CUSTOM PROCEDURE TRAY) ×2 IMPLANT
PAD ARMBOARD 7.5X6 YLW CONV (MISCELLANEOUS) ×4 IMPLANT
PASSER SUT SWANSON 36MM LOOP (INSTRUMENTS) IMPLANT
PINN SECTOR W/GRIP ACE CUP 56 (Hips) ×2 IMPLANT
PRESSURIZER FEMORAL UNIV (MISCELLANEOUS) IMPLANT
SCREW PINNACLE CAN 6.5X40MM (Screw) ×1 IMPLANT
SET HNDPC FAN SPRY TIP SCT (DISPOSABLE) IMPLANT
SLEEVE SURGEON STRL (DRAPES) IMPLANT
SPONGE GAUZE 4X4 12PLY (GAUZE/BANDAGES/DRESSINGS) ×2 IMPLANT
SPONGE LAP 18X18 X RAY DECT (DISPOSABLE) IMPLANT
STAPLER VISISTAT 35W (STAPLE) ×2 IMPLANT
STEM FEM MOD STD 42 13X18X160 (Hips) ×1 IMPLANT
SUT ETHIBOND 2 V 37 (SUTURE) ×2 IMPLANT
SUT ETHILON 3 0 FSL (SUTURE) ×2 IMPLANT
SUT VIC AB 0 CTB1 27 (SUTURE) ×2 IMPLANT
SUT VIC AB 1 CTX 36 (SUTURE) ×2
SUT VIC AB 1 CTX36XBRD ANBCTR (SUTURE) ×1 IMPLANT
SUT VIC AB 2-0 CTB1 (SUTURE) ×2 IMPLANT
SYR 20ML ECCENTRIC (SYRINGE) ×2 IMPLANT
SYR CONTROL 10ML LL (SYRINGE) ×2 IMPLANT
TOWEL OR 17X24 6PK STRL BLUE (TOWEL DISPOSABLE) ×2 IMPLANT
TOWEL OR 17X26 10 PK STRL BLUE (TOWEL DISPOSABLE) ×2 IMPLANT
TOWER CARTRIDGE SMART MIX (DISPOSABLE) IMPLANT
TRAY FOLEY CATH 14FR (SET/KITS/TRAYS/PACK) ×1 IMPLANT
TUBE ANAEROBIC SPECIMEN COL (MISCELLANEOUS) ×2 IMPLANT
WATER STERILE IRR 1000ML POUR (IV SOLUTION) ×6 IMPLANT

## 2012-04-13 NOTE — Preoperative (Signed)
Beta Blockers   Reason not to administer Beta Blockers:Not Applicable 

## 2012-04-13 NOTE — Anesthesia Postprocedure Evaluation (Signed)
Anesthesia Post Note  Patient: Kelsey Sheppard  Procedure(s) Performed: Procedure(s) (LRB): TOTAL HIP REVISION (Left)  Anesthesia type: general  Patient location: PACU  Post pain: Pain level controlled  Post assessment: Patient's Cardiovascular Status Stable  Last Vitals:  Filed Vitals:   04/13/12 1645  BP: 120/60  Pulse: 80  Temp: 36.6 C  Resp: 18    Post vital signs: Reviewed and stable  Level of consciousness: sedated  Complications: No apparent anesthesia complications

## 2012-04-13 NOTE — Transfer of Care (Signed)
Immediate Anesthesia Transfer of Care Note  Patient: Kelsey Sheppard  Procedure(s) Performed: Procedure(s) (LRB) with comments: TOTAL HIP REVISION (Left)  Patient Location: PACU  Anesthesia Type:General  Level of Consciousness: awake, alert  and oriented  Airway & Oxygen Therapy: Patient Spontanous Breathing and Patient connected to face mask oxygen  Post-op Assessment: Report given to PACU RN and Post -op Vital signs reviewed and stable  Post vital signs: Reviewed and stable  Complications: No apparent anesthesia complications

## 2012-04-13 NOTE — Progress Notes (Signed)
Pt. States she sent the hospital and Dr. Wadie Lessen office a letter from her attorney about saving any parts of the ASR hip recall and any tissue from the pt.

## 2012-04-13 NOTE — Interval H&P Note (Signed)
History and Physical Interval Note:  04/13/2012 12:18 PM  Kelsey Sheppard  has presented today for surgery, with the diagnosis of PAINFUL LEFT ASR HIP WITH FLUID   The various methods of treatment have been discussed with the patient and family. After consideration of risks, benefits and other options for treatment, the patient has consented to  Procedure(s) (LRB) with comments: TOTAL HIP REVISION (Left) as a surgical intervention .  The patient's history has been reviewed, patient examined, no change in status, stable for surgery.  I have reviewed the patient's chart and labs.  Questions were answered to the patient's satisfaction.     [name]

## 2012-04-13 NOTE — Anesthesia Preprocedure Evaluation (Signed)
Anesthesia Evaluation  Patient identified by MRN, date of birth, ID band Patient awake    Reviewed: Allergy & Precautions, H&P , NPO status , Patient's Chart, lab work & pertinent test results, reviewed documented beta blocker date and time   Airway Mallampati: II TM Distance: >3 FB Neck ROM: full    Dental   Pulmonary COPD breath sounds clear to auscultation        Cardiovascular hypertension, On Medications Rhythm:regular     Neuro/Psych  Neuromuscular disease negative psych ROS   GI/Hepatic negative GI ROS, Neg liver ROS,   Endo/Other  negative endocrine ROS  Renal/GU negative Renal ROS  negative genitourinary   Musculoskeletal   Abdominal   Peds  Hematology negative hematology ROS (+)   Anesthesia Other Findings See surgeon's H&P   Reproductive/Obstetrics negative OB ROS                           Anesthesia Physical Anesthesia Plan  ASA: III  Anesthesia Plan: General   Post-op Pain Management:    Induction: Intravenous  Airway Management Planned: Oral ETT  Additional Equipment:   Intra-op Plan:   Post-operative Plan: Extubation in OR  Informed Consent: I have reviewed the patients History and Physical, chart, labs and discussed the procedure including the risks, benefits and alternatives for the proposed anesthesia with the patient or authorized representative who has indicated his/her understanding and acceptance.   Dental Advisory Given  Plan Discussed with: CRNA and Surgeon  Anesthesia Plan Comments:         Anesthesia Quick Evaluation

## 2012-04-13 NOTE — Op Note (Signed)
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  Surgeon: Feliberto Gottron. Turner Daniels M.D.  Assistant: Shirl Harris PA-C  Estimated blood loss: 500 cc  Fluid replacement: 1800 cc of crystalloid  Complications: None  Indications: Patient with an ASR on S-ROM total hip that did very well until a few months ago when she had increasing groin pain. The pain wakes her up at night and recently got to the point where he could no longer go walking on a regular basis. 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 Plain x-rays show no change in the position of the components and the stem appears to be well ingrown. Risks and benefits of revision surgery have been discussed and questions answered.  Procedure: Patient was identified by arm band receive preoperative IV antibiotics in the holding area at, and hospital. She was then taken to the operating room where the appropriate anesthetic monitors were attached and general endotracheal anesthesia induced with the patient in the supine position. She was then rolled into the R lateral decubitus position and fixed there with a mark 2 pelvic clamp. A Foley catheter was inserted and the limb prepped and draped in usual sterile fashion from the ankle to the hemipelvis. Time out procedure performed. Skin along the lateral hip and thigh infiltrated with 10 cc of 1/2% Marcaine and epinephrine solution. We began the  operation by recreating the old posterior lateral incision 20 cm in line through the skin and subcutaneous tissue down to the level of the IT band which was cut in line with the skin incision. This exposed the sac of fluid which was sent off for Gram stain and culture. It came back with polys and mononuclear cells, no organisms. The sac was excised. We then remove scar tissue from around to the ASR cup and femoral stem trunnion, dislocated a total hip and removed the ASR head with a mallet and metal cylinder. The scar is scar tissue from the area between the junction of the femoral stem and sleeve. A large screwdriver was then used to rectocele between the sleep in the stem using a large female. We then used the slap hammer extractor to remove the stem. A Cobra retractor was placed and the cotyloid notch retractor just anterior to the acetabular component superiorly. We continued to remove scar tissue from around the acetabular component. A posterior inferior wing retractor was hammered into place. And we began loosening the cup by placing a 1/4 inch osteotome between the edge of the acetabular component and the bone. We then used the short Innomed curved osteotome around the edge of the cup and work around the acetabular component breaking the seal between the bone and acetabulum. At this point the acetabular component and loose and was removed. And she'll a 20-30% bone ingrowth. Without remove fibrous tissue from the base of the acetabulum We reamed up to a 55 mm basket reamer obtaining good coverage in  all quadrants irrigated with normal saline solution. We then hammered into place a 56 mm Gryption cup in 45 of abduction and 20 of anteversion. A single dome screw 45 mm was placed for supplemental fixation. A central occluder was screwed into place followed by a 10 polyethylene liner index posterior and superior. A trial reduction was then performed with a +42 trial femoral component and +0 36 mm femoral head,  instability was noted to 90 of flexion 70 of internal rotation and in full extension the hip could not be dislocated with external rotation. Femoral component was removed and a new [id number] S-ROM stem was hammered into place in 25 of anteversion. At this point a real +6 36 mm ceramic head was hammered into place, the hip reduced and irrigated with normal saline solution. The capsular flap was repaired back to the intertrochanteric crest through drill holes with #2 Ethibond suture. We then closed the IT band with running #1 Vicryl suture, the subcutaneous tissue with 0 and 2-0 undyed Vicryl suture, the skin was closed with running interlocking 3-0 nylon suture. A dressing of Mepilex was then applied, the patient was unclamped a rolled supine awakened extubated and taken to the recovery without difficulty.

## 2012-04-14 DIAGNOSIS — T84018A Broken internal joint prosthesis, other site, initial encounter: Secondary | ICD-10-CM

## 2012-04-14 LAB — CBC
Hemoglobin: 11.1 g/dL — ABNORMAL LOW (ref 12.0–15.0)
MCHC: 32.3 g/dL (ref 30.0–36.0)
Platelets: 297 10*3/uL (ref 150–400)

## 2012-04-14 LAB — BASIC METABOLIC PANEL
GFR calc Af Amer: >90 mL/min (ref >90)
GFR calc non Af Amer: >90 mL/min (ref >90)
Glucose, Bld: 120 mg/dL — ABNORMAL HIGH (ref 70–99)
Potassium: 3.9 mEq/L (ref 3.5–5.1)
Sodium: 139 mEq/L (ref 135–145)

## 2012-04-14 MED ORDER — WHITE PETROLATUM GEL
Status: AC
  Administered 2012-04-14: 11:00:00
  Filled 2012-04-14: qty 5

## 2012-04-14 NOTE — Evaluation (Signed)
Occupational Therapy Evaluation Patient Details Name: Kelsey Sheppard MRN: [id number] DOB: 1950-08-06 Today's Date: 04/14/2012 Time: 4098-1191 OT Time Calculation (min): 11 min  OT Assessment / Plan / Recommendation Clinical Impression  This 61 yo female s/p LTHA revision presents to acute OT with problems below. Will benefit from acute OT without need for follow up.    OT Assessment  Patient needs continued OT Services    Follow Up Recommendations  No OT follow up    Barriers to Discharge None    Equipment Recommendations  None recommended by OT       Frequency  Min 2X/week    Precautions / Restrictions Precautions Precautions: Posterior Hip Precaution Booklet Issued: Yes (comment) Restrictions Weight Bearing Restrictions: Yes LLE Weight Bearing: Weight bearing as tolerated       ADL  Eating/Feeding: Simulated;Independent Where Assessed - Eating/Feeding: Edge of bed Grooming: Supervision/safety;Performed;Wash/dry hands Where Assessed - Grooming: Unsupported standing Upper Body Bathing: Simulated;Supervision/safety Where Assessed - Upper Body Bathing: Unsupported sitting Lower Body Bathing: Simulated;Moderate assistance Where Assessed - Lower Body Bathing: Unsupported sit to stand (no AE) Upper Body Dressing: Simulated;Supervision/safety Where Assessed - Upper Body Dressing: Unsupported sitting Lower Body Dressing: Simulated;+1 Total assistance (without AE) Where Assessed - Lower Body Dressing: Unsupported sit to stand Toilet Transfer: Performed;Supervision/safety Toilet Transfer Method: Sit to Barista: Comfort height toilet;Grab bars Toileting - Architect and Hygiene: Performed;Supervision/safety Where Assessed - Engineer, mining and Hygiene: Standing Equipment Used: Rolling walker Transfers/Ambulation Related to ADLs: Supervison due to pt not remembering her hip precautions    OT Diagnosis: Generalized weakness   OT Problem List: Decreased knowledge of use of DME or AE;Decreased knowledge of precautions OT Treatment Interventions: Self-care/ADL training;Patient/family education;Balance training;DME and/or AE instruction   OT Goals Acute Rehab OT Goals OT Goal Formulation: With patient Time For Goal Achievement: 04/21/12 Potential to Achieve Goals: Good ADL Goals Pt Will Perform Lower Body Dressing: with set-up;with supervision;Sit to stand from chair;Sit to stand from bed;Unsupported;with adaptive equipment ADL Goal: Lower Body Dressing - Progress: Goal set today Pt Will Perform Tub/Shower Transfer: with supervision;Ambulation;with DME (3n1) ADL Goal: Tub/Shower Transfer - Progress: Goal set today Miscellaneous OT Goals Miscellaneous OT Goal #1: Pt will be able to state and follow hip precautions. OT Goal: Miscellaneous Goal #1 - Progress: Goal set today  Visit Information  Last OT Received On: 04/14/12 Assistance Needed: +1 PT/OT Co-Evaluation/Treatment: Yes (partial)    Subjective Data  Subjective: I have all of the equipment due to this is a recall hip Patient Stated Goal: Go home tomorrow   Prior Functioning     Home Living Lives With: Spouse Available Help at Discharge: Family;Friend(s);Available 24 hours/day Type of Home: House Home Access: Stairs to enter [employer] of Steps: 4 Entrance Stairs-Rails: Right;Can reach both Home Layout: One level Bathroom Shower/Tub: Forensic scientist: Standard Home Adaptive Equipment: Bedside commode/3-in-1;Long-handled shoehorn;Long-handled sponge;Reacher;Sock aid;Walker - rolling;Straight cane Prior Function Level of Independence: Independent Able to Take Stairs?: Yes Driving: Yes Vocation: On disability Communication Communication: No difficulties Dominant Hand: Right            Cognition  Overall Cognitive Status: Appears within functional limits for tasks  assessed/performed Arousal/Alertness: Awake/alert Orientation Level: Appears intact for tasks assessed Behavior During Session: [facility] for tasks performed    Extremity/Trunk Assessment Right Upper Extremity Assessment RUE ROM/Strength/Tone: Within functional levels Left Upper Extremity Assessment LUE ROM/Strength/Tone: Within functional levels Right Lower Extremity Assessment RLE ROM/Strength/Tone: [facility] for tasks assessed RLE Sensation:  WFL - Light Touch Left Lower Extremity Assessment LLE ROM/Strength/Tone: Deficits LLE ROM/Strength/Tone Deficits: pt able to actively move against gravity, but not tested secondary to pain.   LLE Sensation: WFL - Light Touch Trunk Assessment Trunk Assessment: Normal     Mobility Bed Mobility Bed Mobility: Not assessed Details for Bed Mobility Assistance: Pt sitting on EOB when we arrived Transfers Sit to Stand: 4: Min guard;With upper extremity assist;From bed;From toilet Stand to Sit: 4: Min guard;With upper extremity assist;To toilet;To chair/3-in-1;With armrests Details for Transfer Assistance: cues to slow down and use UEs and follow hip precautions.          Exercise Total Joint Exercises Ankle Circles/Pumps: AROM;Both;10 reps Quad Sets: AROM;Both;10 reps Long Arc Quad: AROM;Left;10 reps   Balance Balance Balance Assessed: No   End of Session OT - End of Session Equipment Utilized During Treatment:  (RW) Activity Tolerance: Patient tolerated treatment well Patient left: in chair;with call bell/phone within reach       Evette Georges [phone] 04/14/2012, 2:32 PM

## 2012-04-14 NOTE — Progress Notes (Signed)
Utilization review completed. Clotine Heiner, RN, BSN. 

## 2012-04-14 NOTE — Progress Notes (Signed)
Patient ID: Kelsey Sheppard, female   DOB: [birth year], 61 y.o.   MRN: [id number] PATIENT ID: Kelsey Sheppard  MRN: [id number]  DOB/AGE:  10-27-1950 / 61 y.o.  1 Day Post-Op Procedure(s) (LRB): TOTAL HIP REVISION (Left)    PROGRESS NOTE Subjective: Patient is alert, oriented,no Nausea, x1 Vomiting, yes passing gas, no Bowel Movement. Taking PO well. Denies SOB, Chest or Calf Pain. Using Incentive Spirometer, PAS in place. Ambulate WBAT Patient reports pain as 2 on 0-10 scale  .    Objective: Vital signs in last 24 hours: Filed Vitals:   04/13/12 2252 04/14/12 0000 04/14/12 0246 04/14/12 0536  BP: 146/75  137/62 130/58  Pulse: 94  95 94  Temp: 98.4 F (36.9 C)  99.6 F (37.6 C) 99.2 F (37.3 C)  TempSrc:      Resp: 16 18 18 18   SpO2: 96% 96% 98% 99%      Intake/Output from previous day: I/O last 3 completed shifts: In: 2630 [P.O.:480; I.V.:1900; IV Piggyback:250] Out: 2150 [Urine:1650; Blood:500]   Intake/Output this shift:     LABORATORY DATA:  Basename 04/14/12 0500 04/11/12 1342  WBC 8.4 7.9  HGB 11.1* 14.9  HCT 34.4* 45.8  PLT 297 325  NA 139 141  K 3.9 4.1  CL 103 104  CO2 25 28  BUN 9 11  CREATININE 0.51 0.65  GLUCOSE 120* 96  GLUCAP -- --  INR -- 0.91  CALCIUM 8.5 --    Examination: Neurologically intact ABD soft Neurovascular intact Sensation intact distally Intact pulses distally Dorsiflexion/Plantar flexion intact Incision: moderate drainage No cellulitis present Compartment soft} XR AP&Lat of hip shows well placed\fixed THA  Assessment:   1 Day Post-Op Procedure(s) (LRB): TOTAL HIP REVISION (Left) ADDITIONAL DIAGNOSIS:  Hypertension  Plan: PT/OT WBAT, THA  posterior precautions, change dressing today  DVT Prophylaxis: SCDx72 hrs, ASA 325 mg BID x 2 weeks  DISCHARGE PLAN: Home  DISCHARGE NEEDS: HHPT, HHRN, CPM, Walker and 3-in-1 comode seat

## 2012-04-14 NOTE — Evaluation (Signed)
Physical Therapy Evaluation Patient Details Name: Kelsey Sheppard MRN: [id number] DOB: 1951/01/22 Today's Date: 04/14/2012 Time: 4098-1191 PT Time Calculation (min): 21 min  PT Assessment / Plan / Recommendation Clinical Impression  pt presents with L THRevision.  pt very motivated and moving well.  pt has all needed DME and good help at D/C.      PT Assessment  Patient needs continued PT services    Follow Up Recommendations  Home health PT;Supervision - Intermittent    Does the patient have the potential to tolerate intense rehabilitation      Barriers to Discharge None      Equipment Recommendations  None recommended by PT    Recommendations for Other Services     Frequency 7X/week    Precautions / Restrictions Precautions Precautions: Posterior Hip Precaution Booklet Issued: Yes (comment) Restrictions Weight Bearing Restrictions: Yes LLE Weight Bearing: Weight bearing as tolerated   Pertinent Vitals/Pain Pt indicates soreness in L hip.        Mobility  Bed Mobility Bed Mobility: Not assessed Transfers Transfers: Sit to Stand;Stand to Sit Sit to Stand: 4: Min guard;With upper extremity assist;From bed;From toilet Stand to Sit: 4: Min guard;With upper extremity assist;To toilet;To chair/3-in-1;With armrests Details for Transfer Assistance: cues to slow down and use UEs and follow hip precautions.   Ambulation/Gait Ambulation/Gait Assistance: 4: Min guard Ambulation Distance (Feet): 180 Feet Assistive device: Rolling walker Ambulation/Gait Assistance Details: cues for upright posture, positioning within RW Gait Pattern: Step-through pattern;Decreased stride length Stairs: Yes Stairs Assistance: 4: Min guard Stair Management Technique: Two rails;Forwards Number of Stairs: 2  Wheelchair Mobility Wheelchair Mobility: No    Shoulder Instructions     Exercises Total Joint Exercises Ankle Circles/Pumps: AROM;Both;10 reps Quad Sets: AROM;Both;10 reps Long Arc  Quad: AROM;Left;10 reps   PT Diagnosis: Abnormality of gait  PT Problem List: Decreased strength;Decreased activity tolerance;Decreased balance;Decreased mobility;Decreased knowledge of use of DME;Decreased knowledge of precautions PT Treatment Interventions: DME instruction;Gait training;Stair training;Functional mobility training;Therapeutic activities;Therapeutic exercise;Balance training;Patient/family education   PT Goals Acute Rehab PT Goals PT Goal Formulation: With patient Time For Goal Achievement: 04/21/12 Potential to Achieve Goals: Good Pt will go Supine/Side to Sit: with modified independence PT Goal: Supine/Side to Sit - Progress: Goal set today Pt will go Sit to Supine/Side: with modified independence PT Goal: Sit to Supine/Side - Progress: Goal set today Pt will go Sit to Stand: with modified independence PT Goal: Sit to Stand - Progress: Goal set today Pt will go Stand to Sit: with modified independence PT Goal: Stand to Sit - Progress: Goal set today Pt will Ambulate: >150 feet;with modified independence;with rolling walker PT Goal: Ambulate - Progress: Goal set today Pt will Go Up / Down Stairs: 3-5 stairs;with rail(s);with supervision PT Goal: Up/Down Stairs - Progress: Goal set today Pt will Perform Home Exercise Program: Independently PT Goal: Perform Home Exercise Program - Progress: Goal set today  Visit Information  Last PT Received On: 04/14/12 Assistance Needed: +1    Subjective Data  Subjective: I'm ready to go.   Patient Stated Goal: Home   Prior Functioning  Home Living Lives With: Spouse Available Help at Discharge: Family;Friend(s);Available 24 hours/day Type of Home: House Home Access: Stairs to enter [employer] of Steps: 4 Entrance Stairs-Rails: Right;Can reach both Home Layout: One level Bathroom Shower/Tub: Forensic scientist: Standard Home Adaptive Equipment: Bedside commode/3-in-1;Long-handled  shoehorn;Long-handled sponge;Reacher;Sock aid;Walker - rolling;Straight cane Prior Function Level of Independence: Independent Able to Take Stairs?: Yes Driving: Yes  Vocation: On disability Communication Communication: No difficulties Dominant Hand: Right    Cognition  Overall Cognitive Status: Appears within functional limits for tasks assessed/performed Arousal/Alertness: Awake/alert Orientation Level: Appears intact for tasks assessed Behavior During Session: [facility] for tasks performed    Extremity/Trunk Assessment Right Lower Extremity Assessment RLE ROM/Strength/Tone: WFL for tasks assessed RLE Sensation: WFL - Light Touch Left Lower Extremity Assessment LLE ROM/Strength/Tone: Deficits LLE ROM/Strength/Tone Deficits: pt able to actively move against gravity, but not tested secondary to pain.   LLE Sensation: WFL - Light Touch Trunk Assessment Trunk Assessment: Normal   Balance Balance Balance Assessed: No  End of Session PT - End of Session Equipment Utilized During Treatment: Gait belt Activity Tolerance: Patient tolerated treatment well Patient left: in chair;with call bell/phone within reach Nurse Communication: Mobility status  GP     Kelsey Sheppard, [place] [phone] 04/14/2012, 12:46 PM

## 2012-04-14 NOTE — Progress Notes (Signed)
Physical Therapy Note   04/14/12 1400  PT Visit Information  Last PT Received On 04/14/12  Assistance Needed +1  PT Time Calculation  PT Start Time 1344  PT Stop Time 1357  PT Time Calculation (min) 13 min  Subjective Data  Subjective I'm doing good!  Precautions  Precautions Posterior Hip  Precaution Booklet Issued Yes (comment)  Restrictions  Weight Bearing Restrictions Yes  LLE Weight Bearing WBAT  Cognition  Overall Cognitive Status Appears within functional limits for tasks assessed/performed  Arousal/Alertness Awake/alert  Orientation Level Appears intact for tasks assessed  Behavior During Session [facility] for tasks performed  Bed Mobility  Bed Mobility Sit to Supine  Sit to Supine 5: Supervision;With rail  Details for Bed Mobility Assistance cues for positioning of LEs secondary to hip precautions.    Transfers  Transfers Sit to Stand;Stand to Sit  Sit to Stand 5: Supervision;With upper extremity assist;From bed  Stand to Sit 5: Supervision;With upper extremity assist;To bed  Details for Transfer Assistance cues to slow down and use UEs and follow hip precautions.    Ambulation/Gait  Ambulation/Gait Assistance 4: Min guard  Ambulation Distance (Feet) 180 Feet  Assistive device Rolling walker  Ambulation/Gait Assistance Details cues for upright posture  Gait Pattern Step-through pattern;Decreased stride length  Stairs Yes  Stairs Assistance 4: Min guard  Stair Management Technique Two rails;Forwards  Number of Stairs 4   Engineering geologist No  Balance  Balance Assessed No  PT - End of Session  Equipment Utilized During Treatment Gait belt  Activity Tolerance Patient tolerated treatment well  Patient left in bed;with call bell/phone within reach  Nurse Communication Mobility status  PT - Assessment/Plan  Comments on Treatment Session pt presents with L THRevision.  pt making great progress and very motivated to D/C tomorrow.  Feel pt will be  ready to D/C from PT stand point.    PT Plan Discharge plan remains appropriate;Frequency remains appropriate  PT Frequency 7X/week  Follow Up Recommendations Home health PT;Supervision - Intermittent  PT equipment None recommended by PT  Acute Rehab PT Goals  Time For Goal Achievement 04/21/12  Potential to Achieve Goals Good  PT Goal: Sit to Supine/Side - Progress Progressing toward goal  PT Goal: Sit to Stand - Progress Progressing toward goal  PT Goal: Stand to Sit - Progress Progressing toward goal  PT Goal: Ambulate - Progress Progressing toward goal  PT Goal: Up/Down Stairs - Progress Progressing toward goal  PT General Charges  $$ ACUTE PT VISIT 1 Procedure  PT Treatments  $Gait Training 8-22 mins   [place], [place] [phone]

## 2012-04-15 ENCOUNTER — Encounter (HOSPITAL_COMMUNITY): Payer: Self-pay | Admitting: Orthopedic Surgery

## 2012-04-15 LAB — CBC
Hemoglobin: 10.3 g/dL — ABNORMAL LOW (ref 12.0–15.0)
MCH: 30.9 pg (ref 26.0–34.0)
Platelets: 252 10*3/uL (ref 150–400)
RBC: 3.33 MIL/uL — ABNORMAL LOW (ref 3.87–5.11)
WBC: 8.3 10*3/uL (ref 4.0–10.5)

## 2012-04-15 MED ORDER — ONDANSETRON HCL 4 MG PO TABS: 4.0000 mg | ORAL_TABLET | Freq: Four times a day (QID) | ORAL | Status: DC | PRN

## 2012-04-15 MED ORDER — METHOCARBAMOL 500 MG PO TABS: 500.0000 mg | ORAL_TABLET | Freq: Four times a day (QID) | ORAL | Status: DC | PRN

## 2012-04-15 MED ORDER — ASPIRIN 325 MG PO TBEC: 325.0000 mg | DELAYED_RELEASE_TABLET | Freq: Two times a day (BID) | ORAL | Status: DC

## 2012-04-15 MED ORDER — OXYCODONE-ACETAMINOPHEN 5-325 MG PO TABS: 1.0000 | ORAL_TABLET | ORAL | Status: DC | PRN

## 2012-04-15 NOTE — Progress Notes (Signed)
Physical Therapy Treatment Patient Details Name: Kelsey Sheppard MRN: [id number] DOB: March 01, 1951 Today's Date: 04/15/2012 Time: 7829-5621 PT Time Calculation (min): 17 min  PT Assessment / Plan / Recommendation Comments on Treatment Session  pt rpesents with L THRevision.  pt nauseated this am, but still willing to participate.  pt demos good mobility and states she is ready to D/C to home.      Follow Up Recommendations  Home health PT;Supervision - Intermittent     Does the patient have the potential to tolerate intense rehabilitation     Barriers to Discharge        Equipment Recommendations  None recommended by PT    Recommendations for Other Services    Frequency 7X/week   Plan Discharge plan remains appropriate;Frequency remains appropriate    Precautions / Restrictions Precautions Precautions: Posterior Hip Precaution Booklet Issued: Yes (comment) Restrictions Weight Bearing Restrictions: Yes LLE Weight Bearing: Weight bearing as tolerated   Pertinent Vitals/Pain Denies pain, but c/o nausea.  Pt notes RN gave her nausea meds prior to PT.      Mobility  Bed Mobility Bed Mobility: Not assessed Supine to Sit: 6: Modified independent (Device/Increase time);With rails Transfers Transfers: Sit to Stand;Stand to Sit Sit to Stand: 6: Modified independent (Device/Increase time);With upper extremity assist;From chair/3-in-1 Stand to Sit: 6: Modified independent (Device/Increase time);With upper extremity assist;To chair/3-in-1 Details for Transfer Assistance: cues for UE use.   Ambulation/Gait Ambulation/Gait Assistance: 5: Supervision Ambulation Distance (Feet): 180 Feet Assistive device: Rolling walker Ambulation/Gait Assistance Details: demos good use of RW, cues for hip precautions with turns.   Gait Pattern: Step-through pattern;Decreased stride length Stairs: Yes Stairs Assistance: 4: Min guard Stair Management Technique: Two rails;Forwards Number of Stairs: 6   Wheelchair Mobility Wheelchair Mobility: No    Exercises     PT Diagnosis:    PT Problem List:   PT Treatment Interventions:     PT Goals Acute Rehab PT Goals Time For Goal Achievement: 04/21/12 PT Goal: Sit to Stand - Progress: Met PT Goal: Stand to Sit - Progress: Met PT Goal: Ambulate - Progress: Progressing toward goal PT Goal: Up/Down Stairs - Progress: Progressing toward goal  Visit Information  Last PT Received On: 04/15/12 Assistance Needed: +1    Subjective Data  Subjective: That breakfast made me nauseated.     Cognition  Overall Cognitive Status: Appears within functional limits for tasks assessed/performed Arousal/Alertness: Awake/alert Orientation Level: Appears intact for tasks assessed Behavior During Session: [facility] for tasks performed    Balance  Balance Balance Assessed: No Dynamic Standing Balance Dynamic Standing - Balance Support: Right upper extremity supported;Left upper extremity supported Dynamic Standing - Level of Assistance: 6: Modified independent (Device/Increase time)  End of Session PT - End of Session Equipment Utilized During Treatment: Gait belt Activity Tolerance: Patient tolerated treatment well Patient left: in chair;with call bell/phone within reach Nurse Communication: Mobility status   GP     Sunny Schlein, [place] [phone] 04/15/2012, 10:09 AM

## 2012-04-15 NOTE — Progress Notes (Signed)
Occupational Therapy Treatment Patient Details Name: Kelsey Sheppard MRN: [id number] DOB: April 22, 1951 Today's Date: 04/15/2012 Time: 4098-1191 OT Time Calculation (min): 23 min  OT Assessment / Plan / Recommendation Comments on Treatment Session Pt making excellent progress with OT.  Currently modified independent for functional transfers with use of RW and demonstrates proficiency with AE use.  No further OT needs at this time.    Follow Up Recommendations  No OT follow up       Equipment Recommendations  None recommended by OT          Plan All goals met and education completed, patient discharged from OT services    Precautions / Restrictions Precautions Precautions: Posterior Hip Restrictions Weight Bearing Restrictions: No   Pertinent Vitals/Pain Pain 1-2/10  O2 sats 93% on room air    ADL  Grooming: Performed;Modified independent;Wash/dry face;Teeth care Where Assessed - Grooming: Supported standing Lower Body Dressing: Performed;Modified independent Where Assessed - Lower Body Dressing: Unsupported sit to stand Toilet Transfer: Simulated;Modified independent Toilet Transfer Method: Other (comment) (ambulate with RW) Toilet Transfer Equipment: Raised toilet seat with arms (or 3-in-1 over toilet) Toileting - Clothing Manipulation and Hygiene: Simulated;Modified independent Where Assessed - Toileting Clothing Manipulation and Hygiene: Sit to stand from 3-in-1 or toilet Tub/Shower Transfer: Simulated;Modified independent Tub/Shower Transfer Method: Ambulating;Other (comment) (walk-in shower) Equipment Used: Rolling walker;Sock aid;Reacher Transfers/Ambulation Related to ADLs: Pt is overall modified independent for mobility to and from the bathroom adhering to her total hip precautions ADL Comments: Pt able to state 3/3 THR precautions.  She is efficient with AE use and techniques for transferring in and out of the walk-in shower and toilet.  Will have supervision at home.  No  further OT needs.     OT Goals ADL Goals ADL Goal: Lower Body Dressing - Progress: Met ADL Goal: Tub/Shower Transfer - Progress: Met Miscellaneous OT Goals OT Goal: Miscellaneous Goal #1 - Progress: Met  Visit Information  Last OT Received On: 04/15/12 Assistance Needed: +1    Subjective Data  Subjective: I feel a little nauseated. Patient Stated Goal: To go home today hopefully.      Cognition  Overall Cognitive Status: Appears within functional limits for tasks assessed/performed Arousal/Alertness: Awake/alert Orientation Level: Appears intact for tasks assessed Behavior During Session: [facility] for tasks performed    Mobility  Shoulder Instructions Bed Mobility Bed Mobility: Supine to Sit Supine to Sit: 6: Modified independent (Device/Increase time);With rails Transfers Transfers: Sit to Stand Sit to Stand: 6: Modified independent (Device/Increase time);With upper extremity assist;From bed;From chair/3-in-1 Stand to Sit: 6: Modified independent (Device/Increase time);Without upper extremity assist;To bed;To chair/3-in-1          Balance Balance Balance Assessed: Yes Dynamic Standing Balance Dynamic Standing - Balance Support: Right upper extremity supported;Left upper extremity supported Dynamic Standing - Level of Assistance: 6: Modified independent (Device/Increase time)   End of Session OT - End of Session Activity Tolerance: Patient tolerated treatment well Patient left: in chair;with call bell/phone within reach;with family/visitor present Nurse Communication: Mobility status     Tylia Ewell OTR/L Pager number [phone] 04/15/2012, 8:59 AM

## 2012-04-15 NOTE — Discharge Summary (Signed)
Patient ID: Kelsey Sheppard MRN: [id number] DOB/AGE: 08/25/1950 61 y.o.  Admit date: 04/13/2012 Discharge date: 04/15/2012  Admission Diagnoses:  Principal Problem:  *Painfull L ASR Total hip from 2008, Pseudotumor   Discharge Diagnoses:  Same  Past Medical History  Diagnosis Date  . Hypertension   . COPD (chronic obstructive pulmonary disease)   . 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 Arthritis   . Hyperlipemia     Surgeries: Procedure(s): TOTAL HIP REVISION on 04/13/2012   Consultants:    Discharged Condition: Improved  Hospital Course: Kelsey Sheppard is an 61 y.o. female who was admitted 04/13/2012 for operative treatment ofFailed total hip arthroplasty. Patient has severe unremitting pain that affects sleep, daily activities, and work/hobbies. After pre-op clearance the patient was taken to the operating room on 04/13/2012 and underwent  Procedure(s): TOTAL HIP REVISION.    Patient was given perioperative antibiotics: Anti-infectives     Start     Dose/Rate Route Frequency Ordered Stop   04/12/12 1223   ceFAZolin (ANCEF) IVPB 2 g/50 mL premix        2 g 100 mL/hr over 30 Minutes Intravenous 60 min pre-op 04/12/12 1223 04/13/12 1329           Patient was given sequential compression devices, early ambulation, and chemoprophylaxis to prevent DVT.  Patient benefited maximally from hospital stay and there were no complications.    Recent vital signs: Patient Vitals for the past 24 hrs:  BP Temp Temp src Pulse Resp SpO2  04/15/12 0859 - - - 89  18  94 %  04/15/12 0852 - - - 91  - 93 %  04/15/12 0516 116/46 mmHg 99.2 F (37.3 C) Oral 90  16  93 %  May 09, 2012 2138 129/58 mmHg 97.8 F (36.6 C) Oral 88  - 95 %  09-May-2012 1600 - - - - 18  -  05-09-2012 1400 123/51 mmHg 97.8 F (36.6 C) - 87  20  96 %  05-09-2012 1200 - - - - 18  -     Recent laboratory studies:  Basename 04/15/12 0420 09-May-2012 0500  WBC 8.3 8.4  HGB 10.3* 11.1*  HCT  31.1* 34.4*  PLT 252 297  NA -- 139  K -- 3.9  CL -- 103  CO2 -- 25  BUN -- 9  CREATININE -- 0.51  GLUCOSE -- 120*  INR -- --  CALCIUM -- 8.5     Discharge Medications:     Medication List     As of 04/15/2012 10:21 AM    STOP taking these medications         ARTHRITIS PAIN RELIEF PO      TYLENOL 325 MG tablet   Generic drug: acetaminophen      TAKE these medications         aspirin 325 MG EC tablet   Take 1 tablet (325 mg total) by mouth 2 (two) times daily.      atorvastatin 20 MG tablet   Commonly known as: LIPITOR   Take 20 mg by mouth daily.      fish oil-omega-3 fatty acids 1000 MG capsule   Take 1 g by mouth 3 (three) times daily.      losartan-hydrochlorothiazide 100-12.5 MG per tablet   Commonly known as: HYZAAR   Take 1 tablet by mouth daily.      methocarbamol 500 MG tablet   Commonly known as: ROBAXIN   Take 1 tablet (  500 mg total) by mouth every 6 (six) hours as needed.      ondansetron 4 MG tablet   Commonly known as: ZOFRAN   Take 1 tablet (4 mg total) by mouth every 6 (six) hours as needed for nausea.      oxyCODONE-acetaminophen 5-325 MG per tablet   Commonly known as: PERCOCET/ROXICET   Take 1-2 tablets by mouth every 4 (four) hours as needed for pain.      SLEEP AID PO   Take 1 tablet by mouth at bedtime as needed. For sleep      tiotropium 18 MCG inhalation capsule   Commonly known as: SPIRIVA   Place 18 mcg into inhaler and inhale daily.      zolpidem 10 MG tablet   Commonly known as: AMBIEN   Take 10 mg by mouth at bedtime as needed. For insomnia        Diagnostic Studies: Dg Chest 2 View  04/11/2012  *RADIOLOGY REPORT*  Clinical Data: Preadmission respiratory films.  CHEST - 2 VIEW  Comparison: PA and lateral chest 02/11/2007.  Findings: Lung volumes are low with crowding of the bronchovascular structures and accentuation of the cardiac silhouette.  No consolidative process, pneumothorax or effusion is identified.  No focal  bony abnormality.  IMPRESSION: No acute finding in a low-volume chest.   Original Report Authenticated By: Holley Dexter, M.D.    Dg Pelvis Portable  04/13/2012  *RADIOLOGY REPORT*  Clinical Data: Post left hip replacement  PORTABLE PELVIS  Comparison: Portable exam 1616 hours compared to 02/15/2007  Findings: Acetabular and femoral components of a left hip prosthesis are identified in expected positions. No acute fracture or dislocation. No periprosthetic lucency. SI joints symmetric. Right hip joint space preserved.  IMPRESSION: Left hip prosthesis. No acute abnormalities.   Original Report Authenticated By: Ulyses Southward, M.D.    Dg Hip Portable 1 View Left  04/13/2012  *RADIOLOGY REPORT*  Clinical Data: Post left hip replacement  PORTABLE LEFT HIP - 1 VIEW  Comparison: Portable exam 1626 hours compared to accompanying portable pelvic radiograph  Findings: Acetabular femoral components of left hip prosthesis identified in expected positions. Tip of femoral component is not visualized on this exam but is visualized on the pelvic accompanying radiograph. No fracture, dislocation or periprosthetic lucency identified.  IMPRESSION: Left hip prosthesis without acute complication.   Original Report Authenticated By: Ulyses Southward, M.D.     Disposition:       Discharge Orders    Future Orders Please Complete By Expires   Increase activity slowly      Walker       May shower / Bathe      Driving Restrictions      Comments:   No driving for 2 weeks.   Change dressing (specify)      Comments:   Dressing change as needed.   Call MD for:  temperature >100.4      Call MD for:  severe uncontrolled pain      Call MD for:  redness, tenderness, or signs of infection (pain, swelling, redness, odor or green/yellow discharge around incision site)      Discharge instructions      Comments:   F/U with Dr. Turner Daniels as scheduled [phone]         Signed: Hazle Nordmann. 04/15/2012, 10:21  AM

## 2012-04-15 NOTE — Progress Notes (Signed)
CARE MANAGEMENT NOTE 04/15/2012  Patient:  Kelsey Sheppard,Kelsey Sheppard   Account Number:  [id number]  Date Initiated:  04/15/2012  Documentation initiated by:  Vance Peper  Subjective/Objective Assessment:   61 year old female s/p left hip revision secondary to recalled hip     Action/Plan:   Patient was preoperatively setup with [facility], Has rolling walker and 3in1.   Anticipated DC Date:  04/15/2012   Anticipated DC Plan:  HOME W HOME HEALTH SERVICES      DC Planning Services  CM consult      [facility] Choice  HOME HEALTH   Choice offered to / List presented to:  C-1 Patient        HH arranged  HH-2 PT      [facility] agency  [facility]   Status of service:  Completed, signed off Medicare Important Message given?   (If response is "NO", the following Medicare IM given date fields will be blank) Date Medicare IM given:   Date Additional Medicare IM given:    Discharge Disposition:  HOME W HOME HEALTH SERVICES  Per UR Regulation:    If discussed at Long Length of Stay Meetings, dates discussed:    Comments:

## 2012-04-15 NOTE — Progress Notes (Signed)
PATIENT ID: Kelsey Sheppard  MRN: [id number]  DOB/AGE:  08/30/1950 / 61 y.o.  2 Days Post-Op Procedure(s) (LRB): TOTAL HIP REVISION (Left)    PROGRESS NOTE Subjective: Patient is alert, oriented, Nausea and Vomiting this morning (better now), yes passing gas, no Bowel Movement. Taking PO well. Denies SOB, Chest or Calf Pain. Using Incentive Spirometer, PAS in place. Ambulating well with PT Patient reports pain as moderate  .    Objective: Vital signs in last 24 hours: Filed Vitals:   04/14/12 2138 04/15/12 0516 04/15/12 0852 04/15/12 0859  BP: 129/58 116/46    Pulse: 88 90 91 89  Temp: 97.8 F (36.6 C) 99.2 F (37.3 C)    TempSrc: Oral Oral    Resp:  16  18  SpO2: 95% 93% 93% 94%      Intake/Output from previous day: I/O last 3 completed shifts: In: 1600 [P.O.:1200; I.V.:400] Out: 1401 [Urine:1401]   Intake/Output this shift:     LABORATORY DATA:  Basename 04/15/12 0420 04/14/12 0500  WBC 8.3 8.4  HGB 10.3* 11.1*  HCT 31.1* 34.4*  PLT 252 297  NA -- 139  K -- 3.9  CL -- 103  CO2 -- 25  BUN -- 9  CREATININE -- 0.51  GLUCOSE -- 120*  GLUCAP -- --  INR -- --  CALCIUM -- 8.5    Examination: Neurologically intact ABD soft Neurovascular intact Sensation intact distally Intact pulses distally Dorsiflexion/Plantar flexion intact Incision: scant drainage} XR AP&Lat of hip shows well placed\fixed THA  Assessment:   2 Days Post-Op Procedure(s) (LRB): TOTAL HIP REVISION (Left) ADDITIONAL DIAGNOSIS:  none  Plan: PT/OT WBAT, THA  posterior precautions  DVT Prophylaxis: SCDx72 hrs, ASA 325 mg BID x 2 weeks  DISCHARGE PLAN: Home today  DISCHARGE NEEDS: HHPT, HHRN, Walker and 3-in-1 comode seat

## 2012-04-17 LAB — BODY FLUID CULTURE: Culture: NO GROWTH

## 2012-04-18 LAB — ANAEROBIC CULTURE

## 2012-05-19 ENCOUNTER — Encounter: Payer: Self-pay | Admitting: Internal Medicine

## 2012-08-03 ENCOUNTER — Encounter: Payer: Self-pay | Admitting: Internal Medicine

## 2012-09-19 ENCOUNTER — Ambulatory Visit (AMBULATORY_SURGERY_CENTER): Payer: Medicare HMO | Admitting: *Deleted

## 2012-09-19 VITALS — Ht 64.0 in | Wt 235.8 lb

## 2012-09-19 DIAGNOSIS — Z1211 Encounter for screening for malignant neoplasm of colon: Secondary | ICD-10-CM

## 2012-09-19 DIAGNOSIS — Z86010 Personal history of colonic polyps: Secondary | ICD-10-CM

## 2012-09-19 MED ORDER — MOVIPREP 100 G PO SOLR: 1.0000 | Freq: Once | ORAL | Status: DC

## 2012-09-19 NOTE — Progress Notes (Signed)
Denies any difficulty with anesthesia or sedation. Denies allergy to eggs or soy products.

## 2012-09-20 ENCOUNTER — Encounter: Payer: Self-pay | Admitting: Internal Medicine

## 2012-10-03 ENCOUNTER — Ambulatory Visit (AMBULATORY_SURGERY_CENTER): Payer: Medicare HMO | Admitting: Internal Medicine

## 2012-10-03 ENCOUNTER — Encounter: Payer: Self-pay | Admitting: Internal Medicine

## 2012-10-03 VITALS — BP 107/56 | HR 74 | Temp 97.6°F | Resp 16 | Ht 64.0 in | Wt 235.0 lb

## 2012-10-03 DIAGNOSIS — D126 Benign neoplasm of colon, unspecified: Secondary | ICD-10-CM

## 2012-10-03 DIAGNOSIS — Z86010 Personal history of colonic polyps: Secondary | ICD-10-CM

## 2012-10-03 DIAGNOSIS — Z1211 Encounter for screening for malignant neoplasm of colon: Secondary | ICD-10-CM

## 2012-10-03 MED ORDER — SODIUM CHLORIDE 0.9 % IV SOLN: 500.0000 mL | INTRAVENOUS | Status: DC

## 2012-10-03 NOTE — Progress Notes (Signed)
Procedure ends, to recovery awake, report given and VSS. 

## 2012-10-03 NOTE — Progress Notes (Signed)
Called to room to assist during endoscopic procedure.  Patient ID and intended procedure confirmed with present staff. Received instructions for my participation in the procedure from the performing physician.  

## 2012-10-03 NOTE — Patient Instructions (Addendum)
YOU HAD AN ENDOSCOPIC PROCEDURE TODAY AT THE [facility]: Refer to the procedure report that was given to you for any specific questions about what was found during the examination.  If the procedure report does not answer your questions, please call your gastroenterologist to clarify.  If you requested that your care partner not be given the details of your procedure findings, then the procedure report has been included in a sealed envelope for you to review at your convenience later.  YOU SHOULD EXPECT: Some feelings of bloating in the abdomen. Passage of more gas than usual.  Walking can help get rid of the air that was put into your GI tract during the procedure and reduce the bloating. If you had a lower endoscopy (such as a colonoscopy or flexible sigmoidoscopy) you may notice spotting of blood in your stool or on the toilet paper. If you underwent a bowel prep for your procedure, then you may not have a normal bowel movement for a few days.  DIET: Your first meal following the procedure should be a light meal and then it is ok to progress to your normal diet.  A half-sandwich or bowl of soup is an example of a good first meal.  Heavy or fried foods are harder to digest and may make you feel nauseous or bloated.  Likewise meals heavy in dairy and vegetables can cause extra gas to form and this can also increase the bloating.  Drink plenty of fluids but you should avoid alcoholic beverages for 24 hours.  ACTIVITY: Your care partner should take you home directly after the procedure.  You should plan to take it easy, moving slowly for the rest of the day.  You can resume normal activity the day after the procedure however you should NOT DRIVE or use heavy machinery for 24 hours (because of the sedation medicines used during the test).    SYMPTOMS TO REPORT IMMEDIATELY: A gastroenterologist can be reached at any hour.  During normal business hours, 8:30 AM to 5:00 PM Monday through Friday,  call [phone].  After hours and on weekends, please call the GI answering service at [phone] who will take a message and have the physician on call contact you.   Following lower endoscopy (colonoscopy or flexible sigmoidoscopy):  Excessive amounts of blood in the stool  Significant tenderness or worsening of abdominal pains  Swelling of the abdomen that is new, acute  Fever of 100F or higher  FOLLOW UP: If any biopsies were taken you will be contacted by phone or by letter within the next 1-3 weeks.  Call your gastroenterologist if you have not heard about the biopsies in 3 weeks.  Our staff will call the home number listed on your records the next business day following your procedure to check on you and address any questions or concerns that you may have at that time regarding the information given to you following your procedure. This is a courtesy call and so if there is no answer at the home number and we have not heard from you through the emergency physician on call, we will assume that you have returned to your regular daily activities without incident.  SIGNATURES/CONFIDENTIALITY: You and/or your care partner have signed paperwork which will be entered into your electronic medical record.  These signatures attest to the fact that that the information above on your After Visit Summary has been reviewed and is understood.  Full responsibility of the confidentiality of this   discharge information lies with you and/or your care-partner.  Polyps, diverticulosis, high fiber diet-handouts given  Repeat colonoscopy in 5 years.   

## 2012-10-03 NOTE — Progress Notes (Signed)
Patient did not experience any of the following events: a burn prior to discharge; a fall within the facility; wrong site/side/patient/procedure/implant event; or a hospital transfer or hospital admission upon discharge from the facility. (G8907) Patient did not have preoperative order for IV antibiotic SSI prophylaxis. (G8918)  

## 2012-10-03 NOTE — Op Note (Signed)
[facility] [address] [place], [address]   COLONOSCOPY PROCEDURE REPORT  PATIENT: Kelsey, Sheppard  MR#: [id number] BIRTHDATE: 01-Jan-1951 , 62  yrs. old GENDER: Female ENDOSCOPIST: Roxy Cedar, MD REFERRED XB:[id number] Program Recall PROCEDURE DATE:  10/03/2012 PROCEDURE:   Colonoscopy with snare polypectomy   x 2 ASA CLASS:   Class II INDICATIONS:Patient's personal history of adenomatous colon polyps. Index 2006 (TA); 06-2009 (4 polyps) MEDICATIONS: MAC sedation, administered by CRNA and propofol (Diprivan) 220mg  IV  DESCRIPTION OF PROCEDURE:   After the risks benefits and alternatives of the procedure were thoroughly explained, informed consent was obtained.  A digital rectal exam revealed no abnormalities of the rectum.   The LB [id number]  endoscope was introduced through the anus and advanced to the cecum, which was identified by both the appendix and ileocecal valve. No adverse events experienced.   The quality of the prep was good, using MoviPrep  The instrument was then slowly withdrawn as the colon was fully examined.      COLON FINDINGS: Two diminutive polyps were found in the transverse colon.  A polypectomy was performed with a cold snare.  The resection was complete and the polyp tissue was completely retrieved.   Mild diverticulosis was noted in the sigmoid colon. The colon mucosa was otherwise normal.  Retroflexed views revealed no abnormalities. The time to cecum=3 minutes 07 seconds. Withdrawal time=14 minutes 58 seconds.  The scope was withdrawn and the procedure completed. COMPLICATIONS: There were no complications.  ENDOSCOPIC IMPRESSION: 1.   Two diminutive polyps were found in the transverse colon; polypectomy was performed with a cold snare 2.   Mild diverticulosis was noted in the sigmoid colon 3.   The colon mucosa was otherwise normal  RECOMMENDATIONS: 1. Follow up colonoscopy in 5 years   eSigned:  Roxy Cedar, MD 10/03/2012 10:58 AM   cc: Laurann Montana, MD and The Patient   PATIENT NAME:  Kelsey, Sheppard MR#: [id number]

## 2012-10-04 ENCOUNTER — Telehealth: Payer: Self-pay | Admitting: *Deleted

## 2012-10-04 NOTE — Telephone Encounter (Signed)
  Follow up Call-  Call back number 10/03/2012  Post procedure Call Back phone  # [phone] Hm  Permission to leave phone message Yes     Patient questions:  Do you have a fever, pain , or abdominal swelling? no Pain Score  0 *  Have you tolerated food without any problems? yes  Have you been able to return to your normal activities? yes  Do you have any questions about your discharge instructions: Diet   no Medications  no Follow up visit  no  Do you have questions or concerns about your Care? no  Actions: * If pain score is 4 or above: No action needed, pain <4.

## 2012-10-06 ENCOUNTER — Encounter: Payer: Self-pay | Admitting: Internal Medicine

## 2013-12-29 ENCOUNTER — Encounter: Payer: Self-pay | Admitting: Internal Medicine

## 2014-07-16 DIAGNOSIS — G4700 Insomnia, unspecified: Secondary | ICD-10-CM | POA: Diagnosis not present

## 2014-07-16 DIAGNOSIS — I10 Essential (primary) hypertension: Secondary | ICD-10-CM | POA: Diagnosis not present

## 2014-07-16 DIAGNOSIS — J449 Chronic obstructive pulmonary disease, unspecified: Secondary | ICD-10-CM | POA: Diagnosis not present

## 2014-07-16 DIAGNOSIS — Z1389 Encounter for screening for other disorder: Secondary | ICD-10-CM | POA: Diagnosis not present

## 2014-07-16 DIAGNOSIS — E785 Hyperlipidemia, unspecified: Secondary | ICD-10-CM | POA: Diagnosis not present

## 2014-07-24 IMAGING — CR DG HIP 1V PORT*L*
2 series · 2 of 2 positions shown · non-contrast
Comparison: Portable exam 1909 hours compared to accompanying
portable pelvic radiograph

CLINICAL DATA: Post left hip replacement

PORTABLE LEFT HIP - 1 VIEW

[AP]
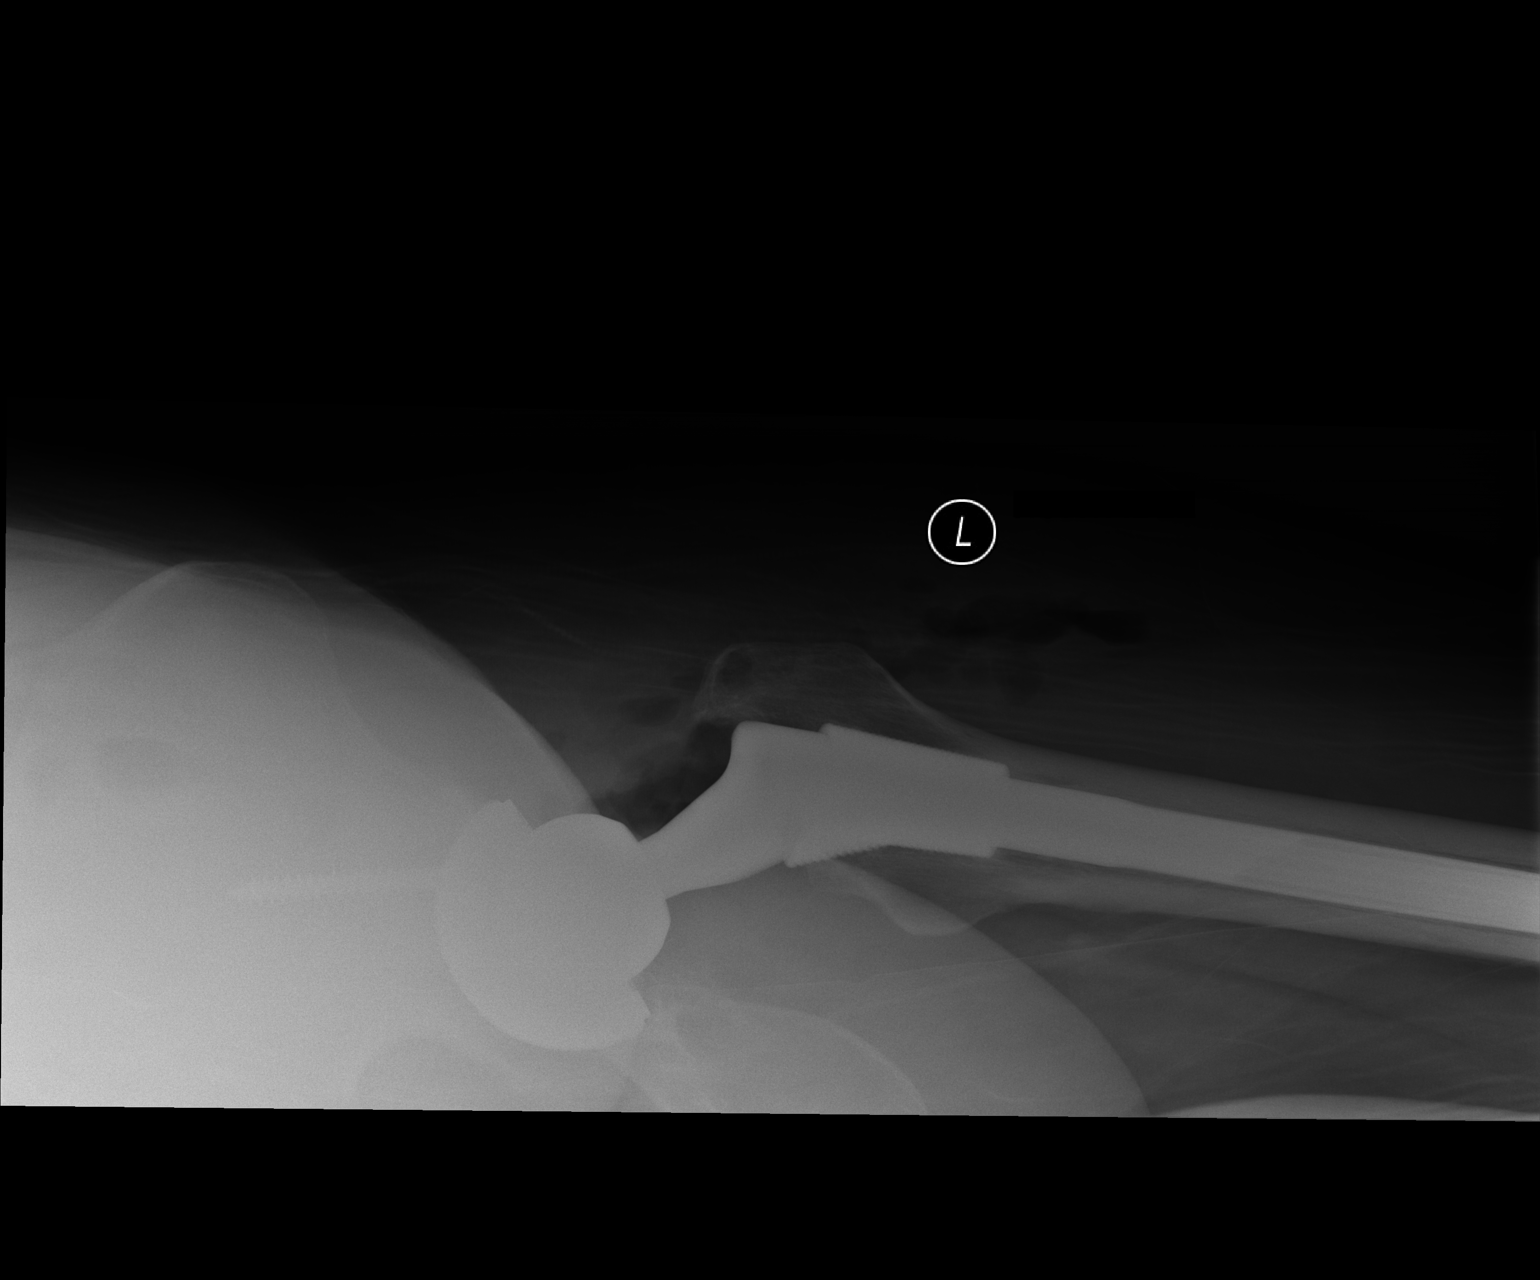

[lateral]
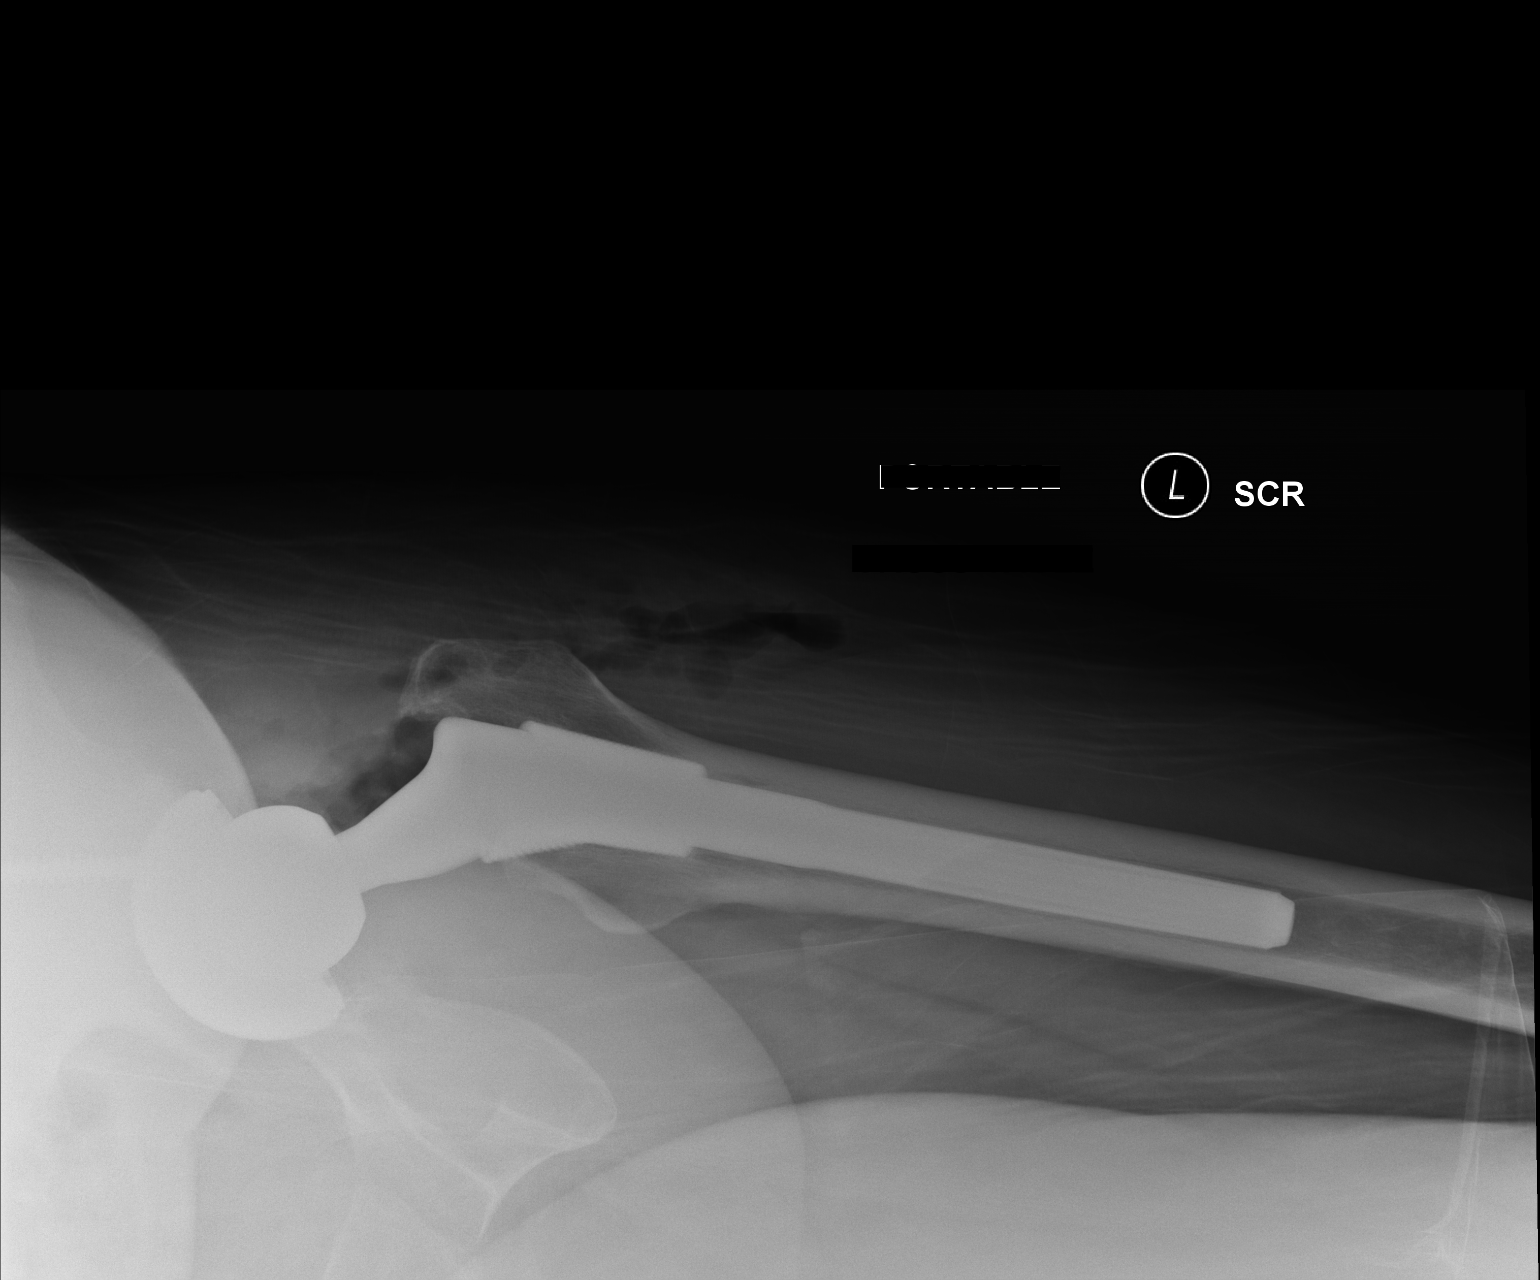

[2 of 2 positions shown; findings below may reference images not displayed]

FINDINGS: Acetabular femoral components of left hip prosthesis identified in
expected positions.
Tip of femoral component is not visualized on this exam but is
visualized on the pelvic accompanying radiograph.
No fracture, dislocation or periprosthetic lucency identified.
IMPRESSION: Left hip prosthesis without acute complication.

## 2014-07-24 IMAGING — CR DG PORTABLE PELVIS
1 series · 1 of 1 positions shown · non-contrast
Comparison: Portable exam 8686 hours compared to 02/15/2007

CLINICAL DATA: Post left hip replacement

PORTABLE PELVIS

[AP]
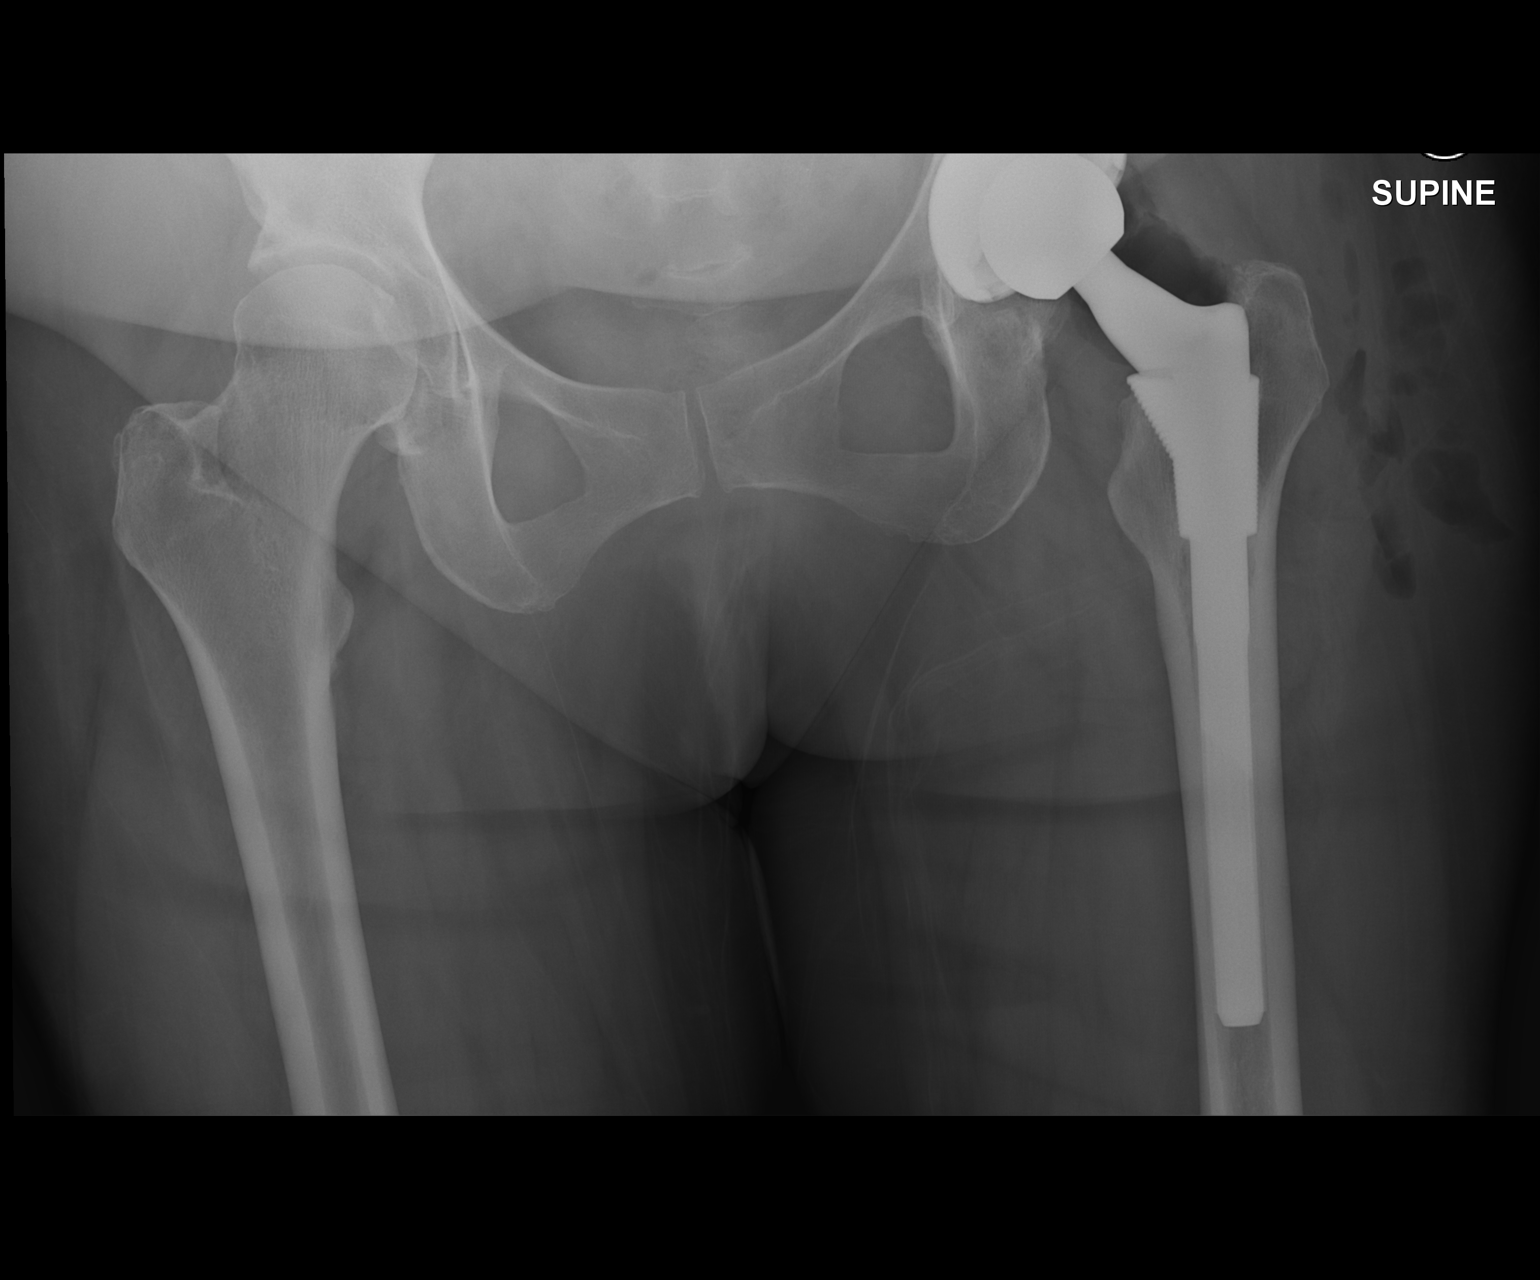

[1 of 1 positions shown; findings below may reference images not displayed]

FINDINGS: Acetabular and femoral components of a left hip prosthesis are
identified in expected positions.
No acute fracture or dislocation.
No periprosthetic lucency.
SI joints symmetric.
Right hip joint space preserved.
IMPRESSION: Left hip prosthesis.
No acute abnormalities.

## 2014-08-20 DIAGNOSIS — Z1231 Encounter for screening mammogram for malignant neoplasm of breast: Secondary | ICD-10-CM | POA: Diagnosis not present

## 2014-12-31 DIAGNOSIS — Z1389 Encounter for screening for other disorder: Secondary | ICD-10-CM | POA: Diagnosis not present

## 2014-12-31 DIAGNOSIS — Z124 Encounter for screening for malignant neoplasm of cervix: Secondary | ICD-10-CM | POA: Diagnosis not present

## 2014-12-31 DIAGNOSIS — Z01419 Encounter for gynecological examination (general) (routine) without abnormal findings: Secondary | ICD-10-CM | POA: Diagnosis not present

## 2014-12-31 DIAGNOSIS — Z6841 Body Mass Index (BMI) 40.0 and over, adult: Secondary | ICD-10-CM | POA: Diagnosis not present

## 2015-01-14 DIAGNOSIS — E785 Hyperlipidemia, unspecified: Secondary | ICD-10-CM | POA: Diagnosis not present

## 2015-01-14 DIAGNOSIS — I10 Essential (primary) hypertension: Secondary | ICD-10-CM | POA: Diagnosis not present

## 2015-01-14 DIAGNOSIS — G4700 Insomnia, unspecified: Secondary | ICD-10-CM | POA: Diagnosis not present

## 2015-01-14 DIAGNOSIS — J449 Chronic obstructive pulmonary disease, unspecified: Secondary | ICD-10-CM | POA: Diagnosis not present

## 2015-07-15 DIAGNOSIS — E785 Hyperlipidemia, unspecified: Secondary | ICD-10-CM | POA: Diagnosis not present

## 2015-07-15 DIAGNOSIS — J449 Chronic obstructive pulmonary disease, unspecified: Secondary | ICD-10-CM | POA: Diagnosis not present

## 2015-07-15 DIAGNOSIS — G4700 Insomnia, unspecified: Secondary | ICD-10-CM | POA: Diagnosis not present

## 2015-07-15 DIAGNOSIS — I10 Essential (primary) hypertension: Secondary | ICD-10-CM | POA: Diagnosis not present

## 2015-08-26 DIAGNOSIS — Z1231 Encounter for screening mammogram for malignant neoplasm of breast: Secondary | ICD-10-CM | POA: Diagnosis not present

## 2016-02-10 DIAGNOSIS — G4700 Insomnia, unspecified: Secondary | ICD-10-CM | POA: Diagnosis not present

## 2016-02-10 DIAGNOSIS — M5136 Other intervertebral disc degeneration, lumbar region: Secondary | ICD-10-CM | POA: Diagnosis not present

## 2016-02-10 DIAGNOSIS — I10 Essential (primary) hypertension: Secondary | ICD-10-CM | POA: Diagnosis not present

## 2016-02-10 DIAGNOSIS — J449 Chronic obstructive pulmonary disease, unspecified: Secondary | ICD-10-CM | POA: Diagnosis not present

## 2016-02-10 DIAGNOSIS — Z0000 Encounter for general adult medical examination without abnormal findings: Secondary | ICD-10-CM | POA: Diagnosis not present

## 2016-02-10 DIAGNOSIS — E785 Hyperlipidemia, unspecified: Secondary | ICD-10-CM | POA: Diagnosis not present

## 2016-08-10 DIAGNOSIS — J449 Chronic obstructive pulmonary disease, unspecified: Secondary | ICD-10-CM | POA: Diagnosis not present

## 2016-08-10 DIAGNOSIS — E785 Hyperlipidemia, unspecified: Secondary | ICD-10-CM | POA: Diagnosis not present

## 2016-08-10 DIAGNOSIS — J0110 Acute frontal sinusitis, unspecified: Secondary | ICD-10-CM | POA: Diagnosis not present

## 2016-08-10 DIAGNOSIS — I10 Essential (primary) hypertension: Secondary | ICD-10-CM | POA: Diagnosis not present

## 2016-10-05 DIAGNOSIS — Z01419 Encounter for gynecological examination (general) (routine) without abnormal findings: Secondary | ICD-10-CM | POA: Diagnosis not present

## 2016-10-21 DIAGNOSIS — Z1231 Encounter for screening mammogram for malignant neoplasm of breast: Secondary | ICD-10-CM | POA: Diagnosis not present

## 2017-02-22 DIAGNOSIS — I10 Essential (primary) hypertension: Secondary | ICD-10-CM | POA: Diagnosis not present

## 2017-02-22 DIAGNOSIS — Z0000 Encounter for general adult medical examination without abnormal findings: Secondary | ICD-10-CM | POA: Diagnosis not present

## 2017-02-22 DIAGNOSIS — F17201 Nicotine dependence, unspecified, in remission: Secondary | ICD-10-CM | POA: Diagnosis not present

## 2017-02-22 DIAGNOSIS — J449 Chronic obstructive pulmonary disease, unspecified: Secondary | ICD-10-CM | POA: Diagnosis not present

## 2017-02-22 DIAGNOSIS — Z6841 Body Mass Index (BMI) 40.0 and over, adult: Secondary | ICD-10-CM | POA: Diagnosis not present

## 2017-02-22 DIAGNOSIS — E785 Hyperlipidemia, unspecified: Secondary | ICD-10-CM | POA: Diagnosis not present

## 2017-08-23 DIAGNOSIS — I10 Essential (primary) hypertension: Secondary | ICD-10-CM | POA: Diagnosis not present

## 2017-08-23 DIAGNOSIS — J449 Chronic obstructive pulmonary disease, unspecified: Secondary | ICD-10-CM | POA: Diagnosis not present

## 2017-08-23 DIAGNOSIS — Z6841 Body Mass Index (BMI) 40.0 and over, adult: Secondary | ICD-10-CM | POA: Diagnosis not present

## 2017-08-23 DIAGNOSIS — F17201 Nicotine dependence, unspecified, in remission: Secondary | ICD-10-CM | POA: Diagnosis not present

## 2017-08-23 DIAGNOSIS — E785 Hyperlipidemia, unspecified: Secondary | ICD-10-CM | POA: Diagnosis not present

## 2017-09-20 ENCOUNTER — Encounter: Payer: Self-pay | Admitting: Internal Medicine

## 2017-10-01 ENCOUNTER — Encounter: Payer: Self-pay | Admitting: Internal Medicine

## 2017-10-25 DIAGNOSIS — Z1231 Encounter for screening mammogram for malignant neoplasm of breast: Secondary | ICD-10-CM | POA: Diagnosis not present

## 2017-12-06 ENCOUNTER — Ambulatory Visit (AMBULATORY_SURGERY_CENTER): Payer: Self-pay | Admitting: *Deleted

## 2017-12-06 VITALS — Ht 64.0 in | Wt 265.0 lb

## 2017-12-06 DIAGNOSIS — Z86010 Personal history of colonic polyps: Secondary | ICD-10-CM

## 2017-12-06 MED ORDER — SUPREP BOWEL PREP KIT 17.5-3.13-1.6 GM/177ML PO SOLN: 1.0000 | Freq: Once | ORAL | 0 refills | Status: AC

## 2017-12-06 NOTE — Progress Notes (Signed)
No egg or soy allergy known to patient  No issues with past sedation with any surgeries  or procedures, no intubation problems  No diet pills per patient No home 02 use per patient  No blood thinners per patient  Pt denies issues with constipation  No A fib or A flutter  EMMI video sent to pt's e mail  

## 2017-12-13 ENCOUNTER — Encounter: Payer: Self-pay | Admitting: Internal Medicine

## 2017-12-20 ENCOUNTER — Encounter: Payer: Self-pay | Admitting: Internal Medicine

## 2017-12-20 ENCOUNTER — Ambulatory Visit (AMBULATORY_SURGERY_CENTER): Payer: Medicare HMO | Admitting: Internal Medicine

## 2017-12-20 VITALS — BP 97/49 | HR 82 | Temp 97.8°F | Resp 14 | Ht 64.0 in | Wt 265.0 lb

## 2017-12-20 DIAGNOSIS — Z86010 Personal history of colonic polyps: Secondary | ICD-10-CM | POA: Diagnosis not present

## 2017-12-20 DIAGNOSIS — D123 Benign neoplasm of transverse colon: Secondary | ICD-10-CM

## 2017-12-20 DIAGNOSIS — D122 Benign neoplasm of ascending colon: Secondary | ICD-10-CM

## 2017-12-20 DIAGNOSIS — Z1211 Encounter for screening for malignant neoplasm of colon: Secondary | ICD-10-CM | POA: Diagnosis not present

## 2017-12-20 MED ORDER — SODIUM CHLORIDE 0.9 % IV SOLN: 500.0000 mL | Freq: Once | INTRAVENOUS | Status: DC

## 2017-12-20 NOTE — Patient Instructions (Signed)
Handouts given:  Diverticulosis and Polyps  YOU HAD AN ENDOSCOPIC PROCEDURE TODAY AT [facility]:   Refer to the procedure report that was given to you for any specific questions about what was found during the examination.  If the procedure report does not answer your questions, please call your gastroenterologist to clarify.  If you requested that your care partner not be given the details of your procedure findings, then the procedure report has been included in a sealed envelope for you to review at your convenience later.  YOU SHOULD EXPECT: Some feelings of bloating in the abdomen. Passage of more gas than usual.  Walking can help get rid of the air that was put into your GI tract during the procedure and reduce the bloating. If you had a lower endoscopy (such as a colonoscopy or flexible sigmoidoscopy) you may notice spotting of blood in your stool or on the toilet paper. If you underwent a bowel prep for your procedure, you may not have a normal bowel movement for a few days.  Please Note:  You might notice some irritation and congestion in your nose or some drainage.  This is from the oxygen used during your procedure.  There is no need for concern and it should clear up in a day or so.  SYMPTOMS TO REPORT IMMEDIATELY:   Following lower endoscopy (colonoscopy or flexible sigmoidoscopy):  Excessive amounts of blood in the stool  Significant tenderness or worsening of abdominal pains  Swelling of the abdomen that is new, acute  Fever of 100F or higher  For urgent or emergent issues, a gastroenterologist can be reached at any hour by calling [phone].   DIET:  We do recommend a small meal at first, but then you may proceed to your regular diet.  Drink plenty of fluids but you should avoid alcoholic beverages for 24 hours.  ACTIVITY:  You should plan to take it easy for the rest of today and you should NOT DRIVE or use heavy machinery until tomorrow (because of the  sedation medicines used during the test).    FOLLOW UP: Our staff will call the number listed on your records the next business day following your procedure to check on you and address any questions or concerns that you may have regarding the information given to you following your procedure. If we do not reach you, we will leave a message.  However, if you are feeling well and you are not experiencing any problems, there is no need to return our call.  We will assume that you have returned to your regular daily activities without incident.  If any biopsies were taken you will be contacted by phone or by letter within the next 1-3 weeks.  Please call us at [phone] if you have not heard about the biopsies in 3 weeks.    SIGNATURES/CONFIDENTIALITY: You and/or your care partner have signed paperwork which will be entered into your electronic medical record.  These signatures attest to the fact that that the information above on your After Visit Summary has been reviewed and is understood.  Full responsibility of the confidentiality of this discharge information lies with you and/or your care-partner.

## 2017-12-20 NOTE — Op Note (Signed)
[facility] Patient Name: Kelsey Sheppard Procedure Date: 12/20/2017 1:13 PM MRN: [id number] Endoscopist: Docia Chuck. Henrene Pastor , MD Age: 67 Referring MD:  Date of Birth: 01-14-1951 Gender: Female Account #: [id number] Procedure:                Colonoscopy, With cold snare polypectomy x 4 Indications:              High risk colon cancer surveillance: Personal                            history of multiple (3 or more) adenomas. Previous                            examinations 2006, 2011, 2014 Medicines:                Monitored Anesthesia Care Procedure:                Pre-Anesthesia Assessment:                           - Prior to the procedure, a History and Physical                            was performed, and patient medications and                            allergies were reviewed. The patient's tolerance of                            previous anesthesia was also reviewed. The risks                            and benefits of the procedure and the sedation                            options and risks were discussed with the patient.                            All questions were answered, and informed consent                            was obtained. Prior Anticoagulants: The patient has                            taken no previous anticoagulant or antiplatelet                            agents. ASA Grade Assessment: II - A patient with                            mild systemic disease. After reviewing the risks                            and benefits, the patient was deemed in  satisfactory condition to undergo the procedure.                           After obtaining informed consent, the colonoscope                            was passed under direct vision. Throughout the                            procedure, the patient's blood pressure, pulse, and                            oxygen saturations were monitored continuously. The   Colonoscope was introduced through the anus and                            advanced to the the cecum, identified by                            appendiceal orifice and ileocecal valve. The                            ileocecal valve, appendiceal orifice, and rectum                            were photographed. The quality of the bowel                            preparation was excellent. The colonoscopy was                            performed without difficulty. The patient tolerated                            the procedure well. The bowel preparation used was                            SUPREP. Scope In: 1:31:32 PM Scope Out: 1:47:34 PM Scope Withdrawal Time: 0 hours 11 minutes 21 seconds  Total Procedure Duration: 0 hours 16 minutes 2 seconds  Findings:                 Four polyps were found in the transverse colon and                            ascending colon. The polyps were 3 to 7 mm in size.                            These polyps were removed with a cold snare.                            Resection and retrieval were complete.                           A few diverticula were  found in the sigmoid colon.                           The exam was otherwise without abnormality on                            direct and retroflexion views. Complications:            No immediate complications. Estimated blood loss:                            None. Estimated Blood Loss:     Estimated blood loss: none. Impression:               - Four 3 to 7 mm polyps in the transverse colon and                            in the ascending colon, removed with a cold snare.                            Resected and retrieved.                           - Diverticulosis in the sigmoid colon.                           - The examination was otherwise normal on direct                            and retroflexion views. Recommendation:           - Repeat colonoscopy in 3 years for surveillance.                           -  Patient has a contact number available for                            emergencies. The signs and symptoms of potential                            delayed complications were discussed with the                            patient. Return to normal activities tomorrow.                            Written discharge instructions were provided to the                            patient.                           - Resume previous diet.                           - Continue present medications.                           -  Await pathology results. Docia Chuck. Henrene Pastor, MD 12/20/2017 1:56:16 PM This report has been signed electronically.

## 2017-12-20 NOTE — Progress Notes (Signed)
A and O x3. Report to RN. Tolerated MAC anesthesia well.

## 2017-12-20 NOTE — Progress Notes (Signed)
Called to room to assist during endoscopic procedure.  Patient ID and intended procedure confirmed with present staff. Received instructions for my participation in the procedure from the performing physician.  

## 2017-12-21 ENCOUNTER — Telehealth: Payer: Self-pay | Admitting: *Deleted

## 2017-12-21 NOTE — Telephone Encounter (Signed)
  Follow up Call-  Call back number 12/20/2017  Post procedure Call Back phone  # [phone]  Permission to leave phone message No  Some recent data might be hidden     Patient questions:  Do you have a fever, pain , or abdominal swelling? No. Pain Score  0 *  Have you tolerated food without any problems? Yes.    Have you been able to return to your normal activities? Yes.    Do you have any questions about your discharge instructions: Diet   No. Medications  No. Follow up visit  No.  Do you have questions or concerns about your Care? No.  Actions: * If pain score is 4 or above: No action needed, pain <4.

## 2017-12-24 ENCOUNTER — Encounter: Payer: Self-pay | Admitting: Internal Medicine

## 2018-02-21 DIAGNOSIS — J449 Chronic obstructive pulmonary disease, unspecified: Secondary | ICD-10-CM | POA: Diagnosis not present

## 2018-02-21 DIAGNOSIS — I10 Essential (primary) hypertension: Secondary | ICD-10-CM | POA: Diagnosis not present

## 2018-02-21 DIAGNOSIS — Z6841 Body Mass Index (BMI) 40.0 and over, adult: Secondary | ICD-10-CM | POA: Diagnosis not present

## 2018-02-21 DIAGNOSIS — M25561 Pain in right knee: Secondary | ICD-10-CM | POA: Diagnosis not present

## 2018-02-21 DIAGNOSIS — E785 Hyperlipidemia, unspecified: Secondary | ICD-10-CM | POA: Diagnosis not present

## 2018-08-22 DIAGNOSIS — E785 Hyperlipidemia, unspecified: Secondary | ICD-10-CM | POA: Diagnosis not present

## 2018-08-22 DIAGNOSIS — I10 Essential (primary) hypertension: Secondary | ICD-10-CM | POA: Diagnosis not present

## 2018-08-22 DIAGNOSIS — J449 Chronic obstructive pulmonary disease, unspecified: Secondary | ICD-10-CM | POA: Diagnosis not present

## 2019-02-09 DIAGNOSIS — M5136 Other intervertebral disc degeneration, lumbar region: Secondary | ICD-10-CM | POA: Diagnosis not present

## 2019-02-09 DIAGNOSIS — M79671 Pain in right foot: Secondary | ICD-10-CM | POA: Diagnosis not present

## 2019-02-09 DIAGNOSIS — M79672 Pain in left foot: Secondary | ICD-10-CM | POA: Diagnosis not present

## 2019-02-09 DIAGNOSIS — J449 Chronic obstructive pulmonary disease, unspecified: Secondary | ICD-10-CM | POA: Diagnosis not present

## 2019-02-09 DIAGNOSIS — E785 Hyperlipidemia, unspecified: Secondary | ICD-10-CM | POA: Diagnosis not present

## 2019-02-09 DIAGNOSIS — I10 Essential (primary) hypertension: Secondary | ICD-10-CM | POA: Diagnosis not present

## 2019-02-16 DIAGNOSIS — Z1231 Encounter for screening mammogram for malignant neoplasm of breast: Secondary | ICD-10-CM | POA: Diagnosis not present

## 2019-03-02 DIAGNOSIS — R921 Mammographic calcification found on diagnostic imaging of breast: Secondary | ICD-10-CM | POA: Diagnosis not present

## 2019-08-10 DIAGNOSIS — M5136 Other intervertebral disc degeneration, lumbar region: Secondary | ICD-10-CM | POA: Diagnosis not present

## 2019-08-10 DIAGNOSIS — I10 Essential (primary) hypertension: Secondary | ICD-10-CM | POA: Diagnosis not present

## 2019-08-10 DIAGNOSIS — J449 Chronic obstructive pulmonary disease, unspecified: Secondary | ICD-10-CM | POA: Diagnosis not present

## 2019-08-10 DIAGNOSIS — E785 Hyperlipidemia, unspecified: Secondary | ICD-10-CM | POA: Diagnosis not present

## 2020-02-14 DIAGNOSIS — I10 Essential (primary) hypertension: Secondary | ICD-10-CM | POA: Diagnosis not present

## 2020-02-14 DIAGNOSIS — E785 Hyperlipidemia, unspecified: Secondary | ICD-10-CM | POA: Diagnosis not present

## 2020-02-14 DIAGNOSIS — Z1159 Encounter for screening for other viral diseases: Secondary | ICD-10-CM | POA: Diagnosis not present

## 2020-02-14 DIAGNOSIS — J449 Chronic obstructive pulmonary disease, unspecified: Secondary | ICD-10-CM | POA: Diagnosis not present

## 2020-08-14 DIAGNOSIS — J449 Chronic obstructive pulmonary disease, unspecified: Secondary | ICD-10-CM | POA: Diagnosis not present

## 2020-08-14 DIAGNOSIS — I10 Essential (primary) hypertension: Secondary | ICD-10-CM | POA: Diagnosis not present

## 2020-08-14 DIAGNOSIS — F17201 Nicotine dependence, unspecified, in remission: Secondary | ICD-10-CM | POA: Diagnosis not present

## 2020-08-14 DIAGNOSIS — E785 Hyperlipidemia, unspecified: Secondary | ICD-10-CM | POA: Diagnosis not present

## 2020-08-14 DIAGNOSIS — G4700 Insomnia, unspecified: Secondary | ICD-10-CM | POA: Diagnosis not present

## 2020-08-14 DIAGNOSIS — H6121 Impacted cerumen, right ear: Secondary | ICD-10-CM | POA: Diagnosis not present

## 2020-10-25 DIAGNOSIS — Z1231 Encounter for screening mammogram for malignant neoplasm of breast: Secondary | ICD-10-CM | POA: Diagnosis not present

## 2021-01-28 ENCOUNTER — Encounter: Payer: Self-pay | Admitting: Internal Medicine

## 2021-02-19 DIAGNOSIS — G4700 Insomnia, unspecified: Secondary | ICD-10-CM | POA: Diagnosis not present

## 2021-02-19 DIAGNOSIS — I10 Essential (primary) hypertension: Secondary | ICD-10-CM | POA: Diagnosis not present

## 2021-02-19 DIAGNOSIS — R946 Abnormal results of thyroid function studies: Secondary | ICD-10-CM | POA: Diagnosis not present

## 2021-02-19 DIAGNOSIS — J449 Chronic obstructive pulmonary disease, unspecified: Secondary | ICD-10-CM | POA: Diagnosis not present

## 2021-02-19 DIAGNOSIS — M5136 Other intervertebral disc degeneration, lumbar region: Secondary | ICD-10-CM | POA: Diagnosis not present

## 2021-02-19 DIAGNOSIS — E785 Hyperlipidemia, unspecified: Secondary | ICD-10-CM | POA: Diagnosis not present

## 2021-08-20 DIAGNOSIS — E785 Hyperlipidemia, unspecified: Secondary | ICD-10-CM | POA: Diagnosis not present

## 2021-08-20 DIAGNOSIS — D582 Other hemoglobinopathies: Secondary | ICD-10-CM | POA: Diagnosis not present

## 2021-08-20 DIAGNOSIS — G629 Polyneuropathy, unspecified: Secondary | ICD-10-CM | POA: Diagnosis not present

## 2021-08-20 DIAGNOSIS — M5136 Other intervertebral disc degeneration, lumbar region: Secondary | ICD-10-CM | POA: Diagnosis not present

## 2021-08-20 DIAGNOSIS — I10 Essential (primary) hypertension: Secondary | ICD-10-CM | POA: Diagnosis not present

## 2021-08-20 DIAGNOSIS — R946 Abnormal results of thyroid function studies: Secondary | ICD-10-CM | POA: Diagnosis not present

## 2021-08-20 DIAGNOSIS — G4700 Insomnia, unspecified: Secondary | ICD-10-CM | POA: Diagnosis not present

## 2021-08-20 DIAGNOSIS — H6121 Impacted cerumen, right ear: Secondary | ICD-10-CM | POA: Diagnosis not present

## 2022-02-18 DIAGNOSIS — R21 Rash and other nonspecific skin eruption: Secondary | ICD-10-CM | POA: Diagnosis not present

## 2022-02-18 DIAGNOSIS — I10 Essential (primary) hypertension: Secondary | ICD-10-CM | POA: Diagnosis not present

## 2022-02-18 DIAGNOSIS — G629 Polyneuropathy, unspecified: Secondary | ICD-10-CM | POA: Diagnosis not present

## 2022-02-18 DIAGNOSIS — J449 Chronic obstructive pulmonary disease, unspecified: Secondary | ICD-10-CM | POA: Diagnosis not present

## 2022-02-18 DIAGNOSIS — M5136 Other intervertebral disc degeneration, lumbar region: Secondary | ICD-10-CM | POA: Diagnosis not present

## 2022-02-18 DIAGNOSIS — R946 Abnormal results of thyroid function studies: Secondary | ICD-10-CM | POA: Diagnosis not present

## 2022-02-18 DIAGNOSIS — E785 Hyperlipidemia, unspecified: Secondary | ICD-10-CM | POA: Diagnosis not present

## 2022-04-16 DIAGNOSIS — E039 Hypothyroidism, unspecified: Secondary | ICD-10-CM | POA: Diagnosis not present

## 2022-06-18 DIAGNOSIS — E039 Hypothyroidism, unspecified: Secondary | ICD-10-CM | POA: Diagnosis not present

## 2022-08-27 DIAGNOSIS — E785 Hyperlipidemia, unspecified: Secondary | ICD-10-CM | POA: Diagnosis not present

## 2022-08-27 DIAGNOSIS — J449 Chronic obstructive pulmonary disease, unspecified: Secondary | ICD-10-CM | POA: Diagnosis not present

## 2022-08-27 DIAGNOSIS — G4700 Insomnia, unspecified: Secondary | ICD-10-CM | POA: Diagnosis not present

## 2022-08-27 DIAGNOSIS — I10 Essential (primary) hypertension: Secondary | ICD-10-CM | POA: Diagnosis not present

## 2022-08-27 DIAGNOSIS — Z6841 Body Mass Index (BMI) 40.0 and over, adult: Secondary | ICD-10-CM | POA: Diagnosis not present

## 2022-08-27 DIAGNOSIS — H9311 Tinnitus, right ear: Secondary | ICD-10-CM | POA: Diagnosis not present

## 2022-08-27 DIAGNOSIS — G629 Polyneuropathy, unspecified: Secondary | ICD-10-CM | POA: Diagnosis not present

## 2022-08-27 DIAGNOSIS — E039 Hypothyroidism, unspecified: Secondary | ICD-10-CM | POA: Diagnosis not present

## 2023-03-03 DIAGNOSIS — E785 Hyperlipidemia, unspecified: Secondary | ICD-10-CM | POA: Diagnosis not present

## 2023-03-03 DIAGNOSIS — G629 Polyneuropathy, unspecified: Secondary | ICD-10-CM | POA: Diagnosis not present

## 2023-03-03 DIAGNOSIS — Z860101 Personal history of adenomatous and serrated colon polyps: Secondary | ICD-10-CM | POA: Diagnosis not present

## 2023-03-03 DIAGNOSIS — E039 Hypothyroidism, unspecified: Secondary | ICD-10-CM | POA: Diagnosis not present

## 2023-03-03 DIAGNOSIS — Z1231 Encounter for screening mammogram for malignant neoplasm of breast: Secondary | ICD-10-CM | POA: Diagnosis not present

## 2023-03-03 DIAGNOSIS — J449 Chronic obstructive pulmonary disease, unspecified: Secondary | ICD-10-CM | POA: Diagnosis not present

## 2023-03-03 DIAGNOSIS — G4700 Insomnia, unspecified: Secondary | ICD-10-CM | POA: Diagnosis not present

## 2023-03-03 DIAGNOSIS — I10 Essential (primary) hypertension: Secondary | ICD-10-CM | POA: Diagnosis not present

## 2023-09-01 DIAGNOSIS — G4700 Insomnia, unspecified: Secondary | ICD-10-CM | POA: Diagnosis not present

## 2023-09-01 DIAGNOSIS — J449 Chronic obstructive pulmonary disease, unspecified: Secondary | ICD-10-CM | POA: Diagnosis not present

## 2023-09-01 DIAGNOSIS — E039 Hypothyroidism, unspecified: Secondary | ICD-10-CM | POA: Diagnosis not present

## 2023-09-01 DIAGNOSIS — R634 Abnormal weight loss: Secondary | ICD-10-CM | POA: Diagnosis not present

## 2023-09-01 DIAGNOSIS — Z860100 Personal history of colon polyps, unspecified: Secondary | ICD-10-CM | POA: Diagnosis not present

## 2023-09-01 DIAGNOSIS — M5136 Other intervertebral disc degeneration, lumbar region with discogenic back pain only: Secondary | ICD-10-CM | POA: Diagnosis not present

## 2023-09-01 DIAGNOSIS — G629 Polyneuropathy, unspecified: Secondary | ICD-10-CM | POA: Diagnosis not present

## 2023-09-01 DIAGNOSIS — R7301 Impaired fasting glucose: Secondary | ICD-10-CM | POA: Diagnosis not present

## 2023-09-01 DIAGNOSIS — I10 Essential (primary) hypertension: Secondary | ICD-10-CM | POA: Diagnosis not present

## 2023-09-01 DIAGNOSIS — E785 Hyperlipidemia, unspecified: Secondary | ICD-10-CM | POA: Diagnosis not present

## 2024-03-01 DIAGNOSIS — I10 Essential (primary) hypertension: Secondary | ICD-10-CM | POA: Diagnosis not present

## 2024-03-01 DIAGNOSIS — R7303 Prediabetes: Secondary | ICD-10-CM | POA: Diagnosis not present

## 2024-03-01 DIAGNOSIS — G4700 Insomnia, unspecified: Secondary | ICD-10-CM | POA: Diagnosis not present

## 2024-03-01 DIAGNOSIS — G629 Polyneuropathy, unspecified: Secondary | ICD-10-CM | POA: Diagnosis not present

## 2024-03-01 DIAGNOSIS — H6123 Impacted cerumen, bilateral: Secondary | ICD-10-CM | POA: Diagnosis not present

## 2024-03-01 DIAGNOSIS — J449 Chronic obstructive pulmonary disease, unspecified: Secondary | ICD-10-CM | POA: Diagnosis not present

## 2024-03-01 DIAGNOSIS — E039 Hypothyroidism, unspecified: Secondary | ICD-10-CM | POA: Diagnosis not present

## 2024-03-01 DIAGNOSIS — E785 Hyperlipidemia, unspecified: Secondary | ICD-10-CM | POA: Diagnosis not present
